# Patient Record
Sex: Female | Born: 1940 | Race: White | Hispanic: No | Marital: Married | Smoking: Never smoker
Health system: Southern US, Community
[De-identification: ages and names within clinical notes are randomized; demographics above are authoritative.]

## PROBLEM LIST (undated history)

## (undated) DIAGNOSIS — E669 Obesity, unspecified: Secondary | ICD-10-CM

## (undated) DIAGNOSIS — E538 Deficiency of other specified B group vitamins: Secondary | ICD-10-CM

## (undated) DIAGNOSIS — R350 Frequency of micturition: Secondary | ICD-10-CM

## (undated) DIAGNOSIS — E785 Hyperlipidemia, unspecified: Secondary | ICD-10-CM

## (undated) DIAGNOSIS — M1990 Unspecified osteoarthritis, unspecified site: Secondary | ICD-10-CM

## (undated) DIAGNOSIS — N6019 Diffuse cystic mastopathy of unspecified breast: Secondary | ICD-10-CM

## (undated) DIAGNOSIS — R55 Syncope and collapse: Secondary | ICD-10-CM

## (undated) DIAGNOSIS — I10 Essential (primary) hypertension: Secondary | ICD-10-CM

## (undated) DIAGNOSIS — D688 Other specified coagulation defects: Secondary | ICD-10-CM

## (undated) DIAGNOSIS — K219 Gastro-esophageal reflux disease without esophagitis: Secondary | ICD-10-CM

## (undated) DIAGNOSIS — R413 Other amnesia: Secondary | ICD-10-CM

## (undated) DIAGNOSIS — G629 Polyneuropathy, unspecified: Secondary | ICD-10-CM

## (undated) DIAGNOSIS — F329 Major depressive disorder, single episode, unspecified: Secondary | ICD-10-CM

## (undated) DIAGNOSIS — F3289 Other specified depressive episodes: Secondary | ICD-10-CM

## (undated) HISTORY — DX: Other specified coagulation defects: D68.8

## (undated) HISTORY — DX: Deficiency of other specified B group vitamins: E53.8

## (undated) HISTORY — PX: TOTAL ABDOMINAL HYSTERECTOMY W/ BILATERAL SALPINGOOPHORECTOMY: SHX83

## (undated) HISTORY — DX: Other amnesia: R41.3

## (undated) HISTORY — PX: EXTERNAL EAR SURGERY: SHX627

## (undated) HISTORY — DX: Polyneuropathy, unspecified: G62.9

## (undated) HISTORY — PX: CHOLECYSTECTOMY: SHX55

## (undated) HISTORY — DX: Major depressive disorder, single episode, unspecified: F32.9

## (undated) HISTORY — DX: Hyperlipidemia, unspecified: E78.5

## (undated) HISTORY — DX: Frequency of micturition: R35.0

## (undated) HISTORY — DX: Unspecified osteoarthritis, unspecified site: M19.90

## (undated) HISTORY — PX: RIB FRACTURE SURGERY: SHX2358

## (undated) HISTORY — PX: CARPAL TUNNEL RELEASE: SHX101

## (undated) HISTORY — DX: Obesity, unspecified: E66.9

## (undated) HISTORY — DX: Essential (primary) hypertension: I10

## (undated) HISTORY — DX: Syncope and collapse: R55

## (undated) HISTORY — DX: Gastro-esophageal reflux disease without esophagitis: K21.9

## (undated) HISTORY — DX: Other specified depressive episodes: F32.89

## (undated) HISTORY — PX: OTHER SURGICAL HISTORY: SHX169

## (undated) HISTORY — DX: Diffuse cystic mastopathy of unspecified breast: N60.19

---

## 2000-01-19 ENCOUNTER — Other Ambulatory Visit: Admission: RE | Admit: 2000-01-19 | Discharge: 2000-01-19 | Payer: Self-pay | Admitting: Internal Medicine

## 2000-09-23 ENCOUNTER — Encounter: Admission: RE | Admit: 2000-09-23 | Discharge: 2000-09-23 | Payer: Self-pay | Admitting: Internal Medicine

## 2000-09-23 ENCOUNTER — Encounter: Payer: Self-pay | Admitting: Internal Medicine

## 2002-02-25 ENCOUNTER — Ambulatory Visit (HOSPITAL_COMMUNITY): Admission: RE | Admit: 2002-02-25 | Discharge: 2002-02-25 | Payer: Self-pay | Admitting: Gastroenterology

## 2002-02-25 ENCOUNTER — Encounter (INDEPENDENT_AMBULATORY_CARE_PROVIDER_SITE_OTHER): Payer: Self-pay | Admitting: Specialist

## 2003-11-25 ENCOUNTER — Ambulatory Visit (HOSPITAL_BASED_OUTPATIENT_CLINIC_OR_DEPARTMENT_OTHER): Admission: RE | Admit: 2003-11-25 | Discharge: 2003-11-25 | Payer: Self-pay | Admitting: Orthopedic Surgery

## 2003-11-25 ENCOUNTER — Ambulatory Visit (HOSPITAL_COMMUNITY): Admission: RE | Admit: 2003-11-25 | Discharge: 2003-11-25 | Payer: Self-pay | Admitting: Orthopedic Surgery

## 2004-02-22 ENCOUNTER — Ambulatory Visit (HOSPITAL_COMMUNITY): Admission: RE | Admit: 2004-02-22 | Discharge: 2004-02-22 | Payer: Self-pay | Admitting: Orthopedic Surgery

## 2004-02-22 ENCOUNTER — Ambulatory Visit (HOSPITAL_BASED_OUTPATIENT_CLINIC_OR_DEPARTMENT_OTHER): Admission: RE | Admit: 2004-02-22 | Discharge: 2004-02-22 | Payer: Self-pay | Admitting: Orthopedic Surgery

## 2004-08-02 ENCOUNTER — Emergency Department (HOSPITAL_COMMUNITY): Admission: EM | Admit: 2004-08-02 | Discharge: 2004-08-02 | Payer: Self-pay | Admitting: Emergency Medicine

## 2005-02-01 ENCOUNTER — Ambulatory Visit (HOSPITAL_COMMUNITY): Admission: RE | Admit: 2005-02-01 | Discharge: 2005-02-01 | Payer: Self-pay | Admitting: Orthopedic Surgery

## 2005-02-01 ENCOUNTER — Encounter (INDEPENDENT_AMBULATORY_CARE_PROVIDER_SITE_OTHER): Payer: Self-pay | Admitting: *Deleted

## 2005-02-01 ENCOUNTER — Ambulatory Visit (HOSPITAL_BASED_OUTPATIENT_CLINIC_OR_DEPARTMENT_OTHER): Admission: RE | Admit: 2005-02-01 | Discharge: 2005-02-01 | Payer: Self-pay | Admitting: Orthopedic Surgery

## 2005-04-26 ENCOUNTER — Ambulatory Visit (HOSPITAL_COMMUNITY): Admission: RE | Admit: 2005-04-26 | Discharge: 2005-04-26 | Payer: Self-pay | Admitting: Gastroenterology

## 2005-10-06 ENCOUNTER — Emergency Department (HOSPITAL_COMMUNITY): Admission: EM | Admit: 2005-10-06 | Discharge: 2005-10-06 | Payer: Self-pay | Admitting: Emergency Medicine

## 2006-02-20 ENCOUNTER — Encounter: Admission: RE | Admit: 2006-02-20 | Discharge: 2006-02-20 | Payer: Self-pay | Admitting: Internal Medicine

## 2007-03-12 ENCOUNTER — Ambulatory Visit: Payer: Self-pay | Admitting: Internal Medicine

## 2007-04-08 ENCOUNTER — Ambulatory Visit: Payer: Self-pay | Admitting: Internal Medicine

## 2007-04-08 LAB — CONVERTED CEMR LAB
BUN: 17 mg/dL (ref 6–23)
Basophils Absolute: 0.1 10*3/uL (ref 0.0–0.1)
Basophils Relative: 1.2 % — ABNORMAL HIGH (ref 0.0–1.0)
Bilirubin Urine: NEGATIVE
Bilirubin, Direct: 0.1 mg/dL (ref 0.0–0.3)
CO2: 29 meq/L (ref 19–32)
Calcium: 9.7 mg/dL (ref 8.4–10.5)
Cholesterol: 181 mg/dL (ref 0–200)
Eosinophils Absolute: 0.3 10*3/uL (ref 0.0–0.6)
GFR calc Af Amer: 80 mL/min
Glucose, Bld: 100 mg/dL — ABNORMAL HIGH (ref 70–99)
Hemoglobin, Urine: NEGATIVE
LDL Cholesterol: 105 mg/dL — ABNORMAL HIGH (ref 0–99)
Lymphocytes Relative: 33.8 % (ref 12.0–46.0)
Monocytes Absolute: 0.4 10*3/uL (ref 0.2–0.7)
Monocytes Relative: 7.5 % (ref 3.0–11.0)
Neutro Abs: 2.6 10*3/uL (ref 1.4–7.7)
Neutrophils Relative %: 51.9 % (ref 43.0–77.0)
Nitrite: NEGATIVE
Specific Gravity, Urine: 1.025 (ref 1.000–1.03)
TSH: 2.85 microintl units/mL (ref 0.35–5.50)
Total Protein, Urine: NEGATIVE mg/dL
Urobilinogen, UA: 0.2 (ref 0.0–1.0)
pH: 6 (ref 5.0–8.0)

## 2007-04-14 ENCOUNTER — Ambulatory Visit: Payer: Self-pay | Admitting: Internal Medicine

## 2007-04-14 DIAGNOSIS — F3289 Other specified depressive episodes: Secondary | ICD-10-CM | POA: Insufficient documentation

## 2007-04-14 DIAGNOSIS — K219 Gastro-esophageal reflux disease without esophagitis: Secondary | ICD-10-CM | POA: Insufficient documentation

## 2007-04-14 DIAGNOSIS — F329 Major depressive disorder, single episode, unspecified: Secondary | ICD-10-CM

## 2007-04-14 DIAGNOSIS — J309 Allergic rhinitis, unspecified: Secondary | ICD-10-CM | POA: Insufficient documentation

## 2007-04-14 DIAGNOSIS — M1610 Unilateral primary osteoarthritis, unspecified hip: Secondary | ICD-10-CM | POA: Insufficient documentation

## 2007-04-14 DIAGNOSIS — I10 Essential (primary) hypertension: Secondary | ICD-10-CM | POA: Insufficient documentation

## 2007-04-14 DIAGNOSIS — Z860100 Personal history of colon polyps, unspecified: Secondary | ICD-10-CM | POA: Insufficient documentation

## 2007-04-14 DIAGNOSIS — A630 Anogenital (venereal) warts: Secondary | ICD-10-CM | POA: Insufficient documentation

## 2007-04-14 DIAGNOSIS — J45909 Unspecified asthma, uncomplicated: Secondary | ICD-10-CM | POA: Insufficient documentation

## 2007-04-14 DIAGNOSIS — E785 Hyperlipidemia, unspecified: Secondary | ICD-10-CM | POA: Insufficient documentation

## 2007-09-23 ENCOUNTER — Telehealth: Payer: Self-pay | Admitting: Internal Medicine

## 2007-10-14 ENCOUNTER — Ambulatory Visit: Payer: Self-pay | Admitting: Internal Medicine

## 2007-10-30 ENCOUNTER — Emergency Department (HOSPITAL_COMMUNITY): Admission: EM | Admit: 2007-10-30 | Discharge: 2007-10-30 | Payer: Self-pay | Admitting: Emergency Medicine

## 2008-05-06 ENCOUNTER — Ambulatory Visit: Payer: Self-pay | Admitting: Internal Medicine

## 2008-05-06 LAB — CONVERTED CEMR LAB
ALT: 23 units/L (ref 0–35)
Albumin: 3.8 g/dL (ref 3.5–5.2)
Alkaline Phosphatase: 65 units/L (ref 39–117)
BUN: 12 mg/dL (ref 6–23)
Chloride: 106 meq/L (ref 96–112)
Direct LDL: 115.2 mg/dL
Glucose, Bld: 80 mg/dL (ref 70–99)
Potassium: 4.1 meq/L (ref 3.5–5.1)
Sodium: 140 meq/L (ref 135–145)
Total CHOL/HDL Ratio: 4.8
Total Protein: 6.9 g/dL (ref 6.0–8.3)
VLDL: 71 mg/dL — ABNORMAL HIGH (ref 0–40)

## 2008-05-07 ENCOUNTER — Encounter: Payer: Self-pay | Admitting: Internal Medicine

## 2008-05-31 ENCOUNTER — Telehealth: Payer: Self-pay | Admitting: Internal Medicine

## 2008-06-03 ENCOUNTER — Ambulatory Visit: Payer: Self-pay | Admitting: Cardiology

## 2008-06-07 ENCOUNTER — Telehealth: Payer: Self-pay | Admitting: Internal Medicine

## 2008-06-07 DIAGNOSIS — J984 Other disorders of lung: Secondary | ICD-10-CM | POA: Insufficient documentation

## 2008-06-10 ENCOUNTER — Ambulatory Visit (HOSPITAL_COMMUNITY): Admission: RE | Admit: 2008-06-10 | Discharge: 2008-06-10 | Payer: Self-pay | Admitting: Internal Medicine

## 2008-06-11 ENCOUNTER — Telehealth: Payer: Self-pay | Admitting: Internal Medicine

## 2008-06-11 ENCOUNTER — Ambulatory Visit: Payer: Self-pay | Admitting: Internal Medicine

## 2008-06-21 ENCOUNTER — Ambulatory Visit: Payer: Self-pay | Admitting: Internal Medicine

## 2008-06-21 ENCOUNTER — Encounter: Payer: Self-pay | Admitting: Internal Medicine

## 2008-06-24 ENCOUNTER — Encounter: Payer: Self-pay | Admitting: Internal Medicine

## 2008-07-26 ENCOUNTER — Ambulatory Visit: Payer: Self-pay | Admitting: Internal Medicine

## 2008-07-26 DIAGNOSIS — J438 Other emphysema: Secondary | ICD-10-CM | POA: Insufficient documentation

## 2008-10-13 ENCOUNTER — Encounter (INDEPENDENT_AMBULATORY_CARE_PROVIDER_SITE_OTHER): Payer: Self-pay | Admitting: *Deleted

## 2008-11-04 ENCOUNTER — Ambulatory Visit: Payer: Self-pay | Admitting: Internal Medicine

## 2008-11-04 LAB — CONVERTED CEMR LAB
AST: 27 units/L (ref 0–37)
BUN: 18 mg/dL (ref 6–23)
Cholesterol: 194 mg/dL (ref 0–200)
Direct LDL: 104.1 mg/dL
GFR calc Af Amer: 71 mL/min
GFR calc non Af Amer: 58 mL/min
Glucose, Bld: 102 mg/dL — ABNORMAL HIGH (ref 70–99)
Total CHOL/HDL Ratio: 5.1
Triglycerides: 439 mg/dL (ref 0–149)
VLDL: 88 mg/dL — ABNORMAL HIGH (ref 0–40)

## 2009-01-24 ENCOUNTER — Encounter: Payer: Self-pay | Admitting: Internal Medicine

## 2009-01-26 ENCOUNTER — Encounter: Payer: Self-pay | Admitting: Internal Medicine

## 2009-04-01 ENCOUNTER — Telehealth: Payer: Self-pay | Admitting: Internal Medicine

## 2009-04-01 ENCOUNTER — Encounter: Payer: Self-pay | Admitting: Internal Medicine

## 2009-04-04 ENCOUNTER — Ambulatory Visit: Payer: Self-pay | Admitting: Internal Medicine

## 2009-04-04 LAB — CONVERTED CEMR LAB
CO2: 29 meq/L (ref 19–32)
Calcium: 9.2 mg/dL (ref 8.4–10.5)
Creatinine, Ser: 0.9 mg/dL (ref 0.4–1.2)
GFR calc non Af Amer: 65.74 mL/min (ref >60)
Sodium: 140 meq/L (ref 135–145)

## 2009-04-06 ENCOUNTER — Ambulatory Visit: Payer: Self-pay | Admitting: Cardiology

## 2009-05-09 ENCOUNTER — Ambulatory Visit: Payer: Self-pay | Admitting: Internal Medicine

## 2009-05-19 ENCOUNTER — Telehealth: Payer: Self-pay | Admitting: Internal Medicine

## 2009-08-16 ENCOUNTER — Ambulatory Visit: Payer: Self-pay | Admitting: Family Medicine

## 2009-08-16 DIAGNOSIS — L723 Sebaceous cyst: Secondary | ICD-10-CM | POA: Insufficient documentation

## 2009-08-17 ENCOUNTER — Ambulatory Visit: Payer: Self-pay | Admitting: Family Medicine

## 2009-08-17 ENCOUNTER — Encounter: Payer: Self-pay | Admitting: Family Medicine

## 2009-08-20 ENCOUNTER — Encounter: Payer: Self-pay | Admitting: Family Medicine

## 2009-10-31 ENCOUNTER — Ambulatory Visit: Payer: Self-pay | Admitting: Internal Medicine

## 2009-10-31 LAB — CONVERTED CEMR LAB
AST: 20 units/L (ref 0–37)
Albumin: 3.7 g/dL (ref 3.5–5.2)
BUN: 13 mg/dL (ref 6–23)
Basophils Absolute: 0 10*3/uL (ref 0.0–0.1)
Calcium: 9.3 mg/dL (ref 8.4–10.5)
Cholesterol: 178 mg/dL (ref 0–200)
Creatinine, Ser: 1 mg/dL (ref 0.4–1.2)
Eosinophils Relative: 2.1 % (ref 0.0–5.0)
Folate: 13.8 ng/mL
GFR calc non Af Amer: 58.11 mL/min (ref >60)
HDL: 49.6 mg/dL (ref >39.00)
Hemoglobin: 13.5 g/dL (ref 12.0–15.0)
Lymphocytes Relative: 29.7 % (ref 12.0–46.0)
Monocytes Absolute: 0.5 10*3/uL (ref 0.1–1.0)
Neutro Abs: 4.3 10*3/uL (ref 1.4–7.7)
Platelets: 198 10*3/uL (ref 150.0–400.0)
Potassium: 3.5 meq/L (ref 3.5–5.1)
RBC: 4.19 M/uL (ref 3.87–5.11)
Sodium: 142 meq/L (ref 135–145)
Total Bilirubin: 1.1 mg/dL (ref 0.3–1.2)
Total CHOL/HDL Ratio: 4
VLDL: 52.6 mg/dL — ABNORMAL HIGH (ref 0.0–40.0)
Vitamin B-12: 1302 pg/mL — ABNORMAL HIGH (ref 211–911)

## 2010-01-25 ENCOUNTER — Encounter: Payer: Self-pay | Admitting: Internal Medicine

## 2010-02-28 ENCOUNTER — Telehealth: Payer: Self-pay | Admitting: Internal Medicine

## 2010-02-28 ENCOUNTER — Ambulatory Visit: Payer: Self-pay | Admitting: Internal Medicine

## 2010-03-29 ENCOUNTER — Telehealth: Payer: Self-pay | Admitting: Internal Medicine

## 2010-05-02 ENCOUNTER — Ambulatory Visit: Payer: Self-pay | Admitting: Internal Medicine

## 2010-05-02 ENCOUNTER — Encounter: Payer: Self-pay | Admitting: Internal Medicine

## 2010-05-02 DIAGNOSIS — R300 Dysuria: Secondary | ICD-10-CM | POA: Insufficient documentation

## 2010-05-02 LAB — CONVERTED CEMR LAB
ALT: 17 units/L (ref 0–35)
AST: 24 units/L (ref 0–37)
Alkaline Phosphatase: 45 units/L (ref 39–117)
BUN: 13 mg/dL (ref 6–23)
Bilirubin, Direct: 0.2 mg/dL (ref 0.0–0.3)
CO2: 30 meq/L (ref 19–32)
Chloride: 101 meq/L (ref 96–112)
Cholesterol: 168 mg/dL (ref 0–200)
Direct LDL: 99.9 mg/dL
Eosinophils Absolute: 0.2 10*3/uL (ref 0.0–0.7)
Eosinophils Relative: 3.4 % (ref 0.0–5.0)
HCT: 39.4 % (ref 36.0–46.0)
HDL: 38.3 mg/dL — ABNORMAL LOW (ref >39.00)
Lymphocytes Relative: 35 % (ref 12.0–46.0)
Lymphs Abs: 2.3 10*3/uL (ref 0.7–4.0)
Monocytes Absolute: 0.5 10*3/uL (ref 0.1–1.0)
Monocytes Relative: 7 % (ref 3.0–12.0)
Neutro Abs: 3.5 10*3/uL (ref 1.4–7.7)
Neutrophils Relative %: 54.1 % (ref 43.0–77.0)
Potassium: 3.9 meq/L (ref 3.5–5.1)
RBC: 4.1 M/uL (ref 3.87–5.11)
Sodium: 142 meq/L (ref 135–145)
Total Bilirubin: 1.1 mg/dL (ref 0.3–1.2)
Total CHOL/HDL Ratio: 4
Triglycerides: 231 mg/dL — ABNORMAL HIGH (ref 0.0–149.0)
VLDL: 46.2 mg/dL — ABNORMAL HIGH (ref 0.0–40.0)

## 2010-05-08 ENCOUNTER — Telehealth: Payer: Self-pay | Admitting: Internal Medicine

## 2010-05-18 ENCOUNTER — Telehealth: Payer: Self-pay | Admitting: Internal Medicine

## 2010-05-25 ENCOUNTER — Telehealth: Payer: Self-pay | Admitting: Internal Medicine

## 2010-07-07 IMAGING — CR DG CHEST 2V
2 series · 2 of 2 positions shown · non-contrast
Comparison: Chest x-ray 10/06/2005

CLINICAL DATA: Right-sided chest pain for several weeks,
hypertension

CHEST - 2 VIEW

[view not recorded (1 of 2)]
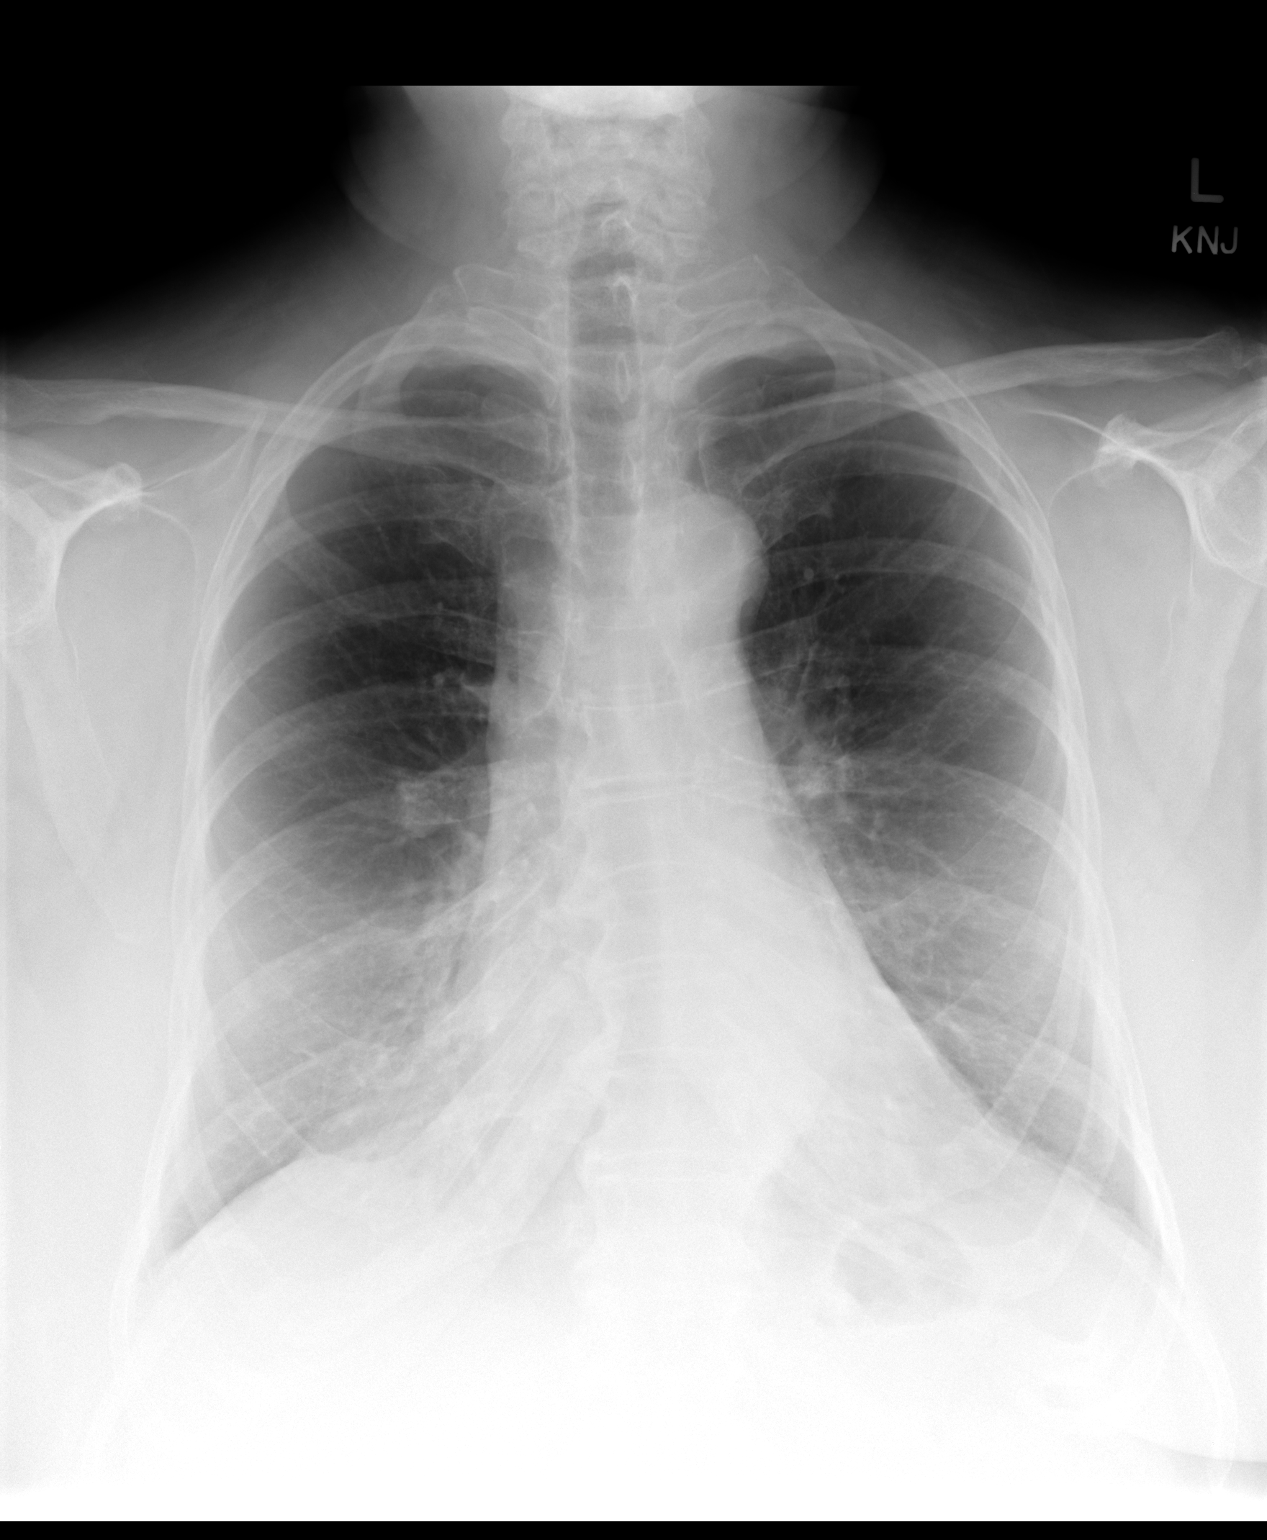

[view not recorded (2 of 2)]
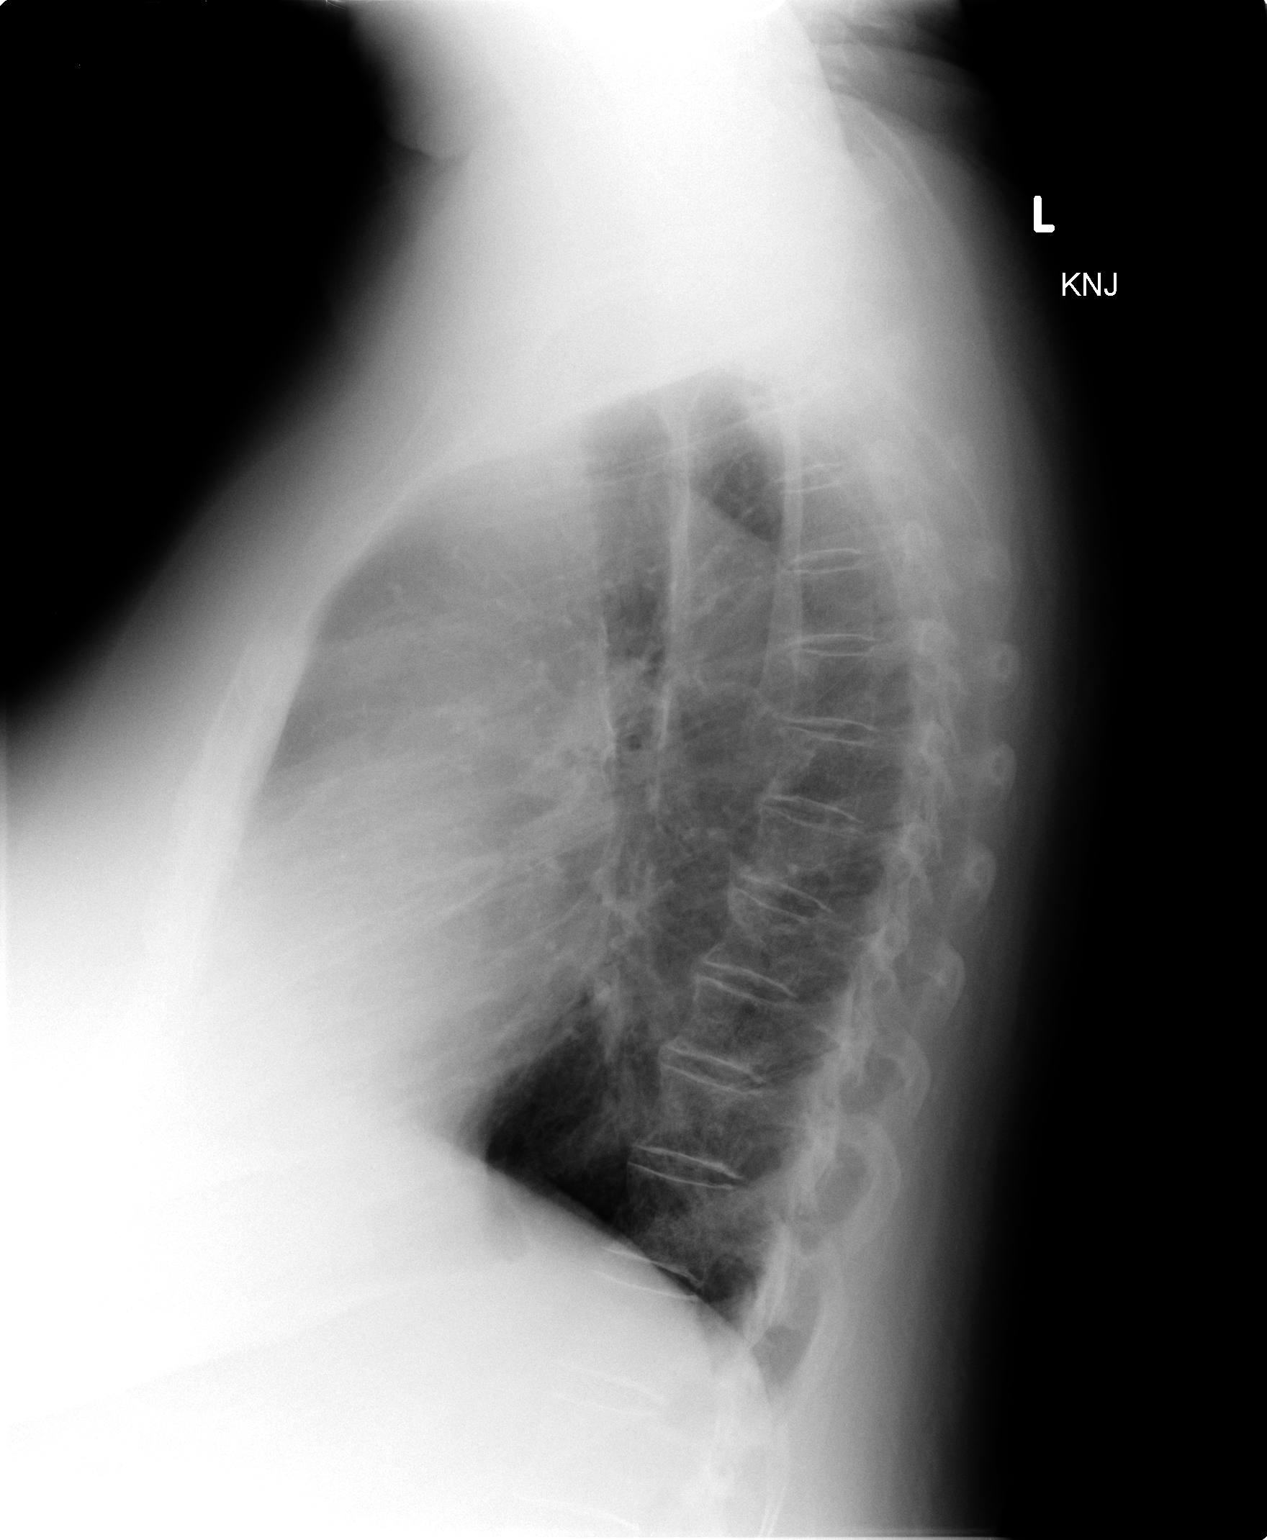

[2 of 2 positions shown; findings below may reference images not displayed]

FINDINGS: No active infiltrate or effusion is seen.  The somewhat
nodular appearance of the left hilum is noted on the frontal view
which is not seen on prior film dating back to [DATE].  Either
follow-up chest x-ray or CT chest is recommended.  Heart is within
normal limits in size.   Mild degenerative change is noted in the
thoracic spine.
IMPRESSION: 1.  No active lung disease.
2.  Slightly nodular left hilum appears to represent a change when
compared to a chest x-ray of 3111.  Consider follow-up chest x-ray
or CT chest to assess further.

## 2010-08-31 ENCOUNTER — Telehealth: Payer: Self-pay | Admitting: Internal Medicine

## 2010-10-10 NOTE — Progress Notes (Signed)
  Phone Note Refill Request Message from:  Fax from Pharmacy on May 08, 2010 2:33 PM  Refills Requested: Medication #1:  NEXIUM 40 MG CPDR 1 by mouth qd  Medication #2:  FLUOXETINE HCL 20 MG  TABS take 1 by mouth once daily  6 MONTH FOLLOW-UP IS DUE Initial call taken by: Ami Bullins CMA,  May 08, 2010 2:33 PM    Prescriptions: FLUOXETINE HCL 20 MG  TABS (FLUOXETINE HCL) take 1 by mouth once daily  6 MONTH FOLLOW-UP IS DUE  #90 x 3   Entered by:   Ami Bullins CMA   Authorized by:   Jacques Navy MD   Signed by:   Bill Salinas CMA on 05/08/2010   Method used:   Faxed to ...       [facility] (mail-order)             , [place]         Ph: [phone]       Fax: [phone]   RxID:   [id number] NEXIUM 40 MG CPDR (ESOMEPRAZOLE MAGNESIUM) 1 by mouth qd  #90 x 3   Entered by:   Ami Bullins CMA   Authorized by:   Jacques Navy MD   Signed by:   Bill Salinas CMA on 05/08/2010   Method used:   Faxed to ...       [facility] (mail-order)             , [place]         Ph: [phone]       Fax: [phone]   RxID:   [id number]

## 2010-10-10 NOTE — Progress Notes (Signed)
Summary: CALL?  Phone Note Call from Patient   Summary of Call: Patient is requesting a call from MD.  Initial call taken by: Lamar Sprinkles, CMA,  May 18, 2010 3:36 PM  Follow-up for Phone Call        Needs refills, she recieved nexium and fluoxetine but not others that were sent in and pharm told pt that others were not recieved. Resent rx's, Pt informed  Follow-up by: Lamar Sprinkles, CMA,  May 18, 2010 5:42 PM    Prescriptions: SULFAMETHOXAZOLE-TRIMETHOPRIM 400-80 MG TABS (SULFAMETHOXAZOLE-TRIMETHOPRIM) 1 by mouth two times a day x 3 days as needed for bladder problems  #60 x 0   Entered by:   Lamar Sprinkles, CMA   Authorized by:   Jacques Navy MD   Signed by:   Lamar Sprinkles, CMA on 05/18/2010   Method used:   Faxed to ...       [facility] (mail-order)             , [place]         Ph: [phone]       Fax: [phone]   RxID:   [id number] AVALIDE 150-12.5 MG TABS (IRBESARTAN-HYDROCHLOROTHIAZIDE) 1 tab by mouth once daily  #90 x 3   Entered by:   Lamar Sprinkles, CMA   Authorized by:   Jacques Navy MD   Signed by:   Lamar Sprinkles, CMA on 05/18/2010   Method used:   Faxed to ...       [facility] (mail-order)             , [place]         Ph: [phone]       Fax: [phone]   RxID:   [id number] MELOXICAM 15 MG TABS (MELOXICAM) 1 by mouth once daily for back pain and other arthritic pain.  #90 x 3   Entered by:   Lamar Sprinkles, CMA   Authorized by:   Jacques Navy MD   Signed by:   Lamar Sprinkles, CMA on 05/18/2010   Method used:   Faxed to ...       [facility] (mail-order)             , [place]         Ph: [phone]       Fax: [phone]   RxID:   [id number] FEXOFENADINE HCL 180 MG  TABS (FEXOFENADINE HCL) once daily  #90 x 3   Entered by:   Lamar Sprinkles, CMA   Authorized by:   Jacques Navy MD   Signed by:   Lamar Sprinkles, CMA on 05/18/2010   Method used:   Faxed to ...       [facility]  (mail-order)             , [place]         Ph: [phone]       Fax: [phone]   RxID:   [id number] CRESTOR 10 MG  TABS (ROSUVASTATIN CALCIUM) q PM  #90 x 3   Entered by:   Lamar Sprinkles, CMA   Authorized by:   Jacques Navy MD   Signed by:   Lamar Sprinkles, CMA on 05/18/2010   Method used:   Faxed to ...       [facility] (mail-order)             , [place]         Ph: [phone]  Fax: [phone]   RxID:   [id number]

## 2010-10-10 NOTE — Assessment & Plan Note (Signed)
Summary: pain under left breast x4 days/sob/talking to sarah/cd   Vital Signs:  Patient profile:   70 year old female Height:      65 inches Weight:      192 pounds BMI:     32.07 O2 Sat:      93 % Temp:     97.0 degrees F oral Pulse rate:   85 / minute BP sitting:   112 / 82  (left arm) Cuff size:   regular  Vitals Entered By: Bill Salinas CMA (February 28, 2010 11:58 AM) CC: pt here  with c/o pain under her left breast , pt states pain is painful to the touch. symptoms present x 1 week/ ab   Primary Care Provider:  Jacques Navy MD  CC:  pt here  with c/o pain under her left breast  and pt states pain is painful to the touch. symptoms present x 1 week/ ab.  History of Present Illness: 7-10 days of acute pain in the left chest: exacerbated by any movement. The pain is limiting her activities. No diaphoresis, no substernal chest chest. No fever, chills, productive cough. She has had some releif with aspercreme, roll on freeze.   Current Medications (verified): 1)  Nexium 40 Mg Cpdr (Esomeprazole Magnesium) .Marland Kitchen.. 1 By Mouth Qd 2)  Crestor 10 Mg  Tabs (Rosuvastatin Calcium) .... Q Pm 3)  Fexofenadine Hcl 180 Mg  Tabs (Fexofenadine Hcl) .... Once Daily 4)  Meloxicam 15 Mg Tabs (Meloxicam) .Marland Kitchen.. 1 By Mouth Once Daily For Back Pain and Other Arthritic Pain. 5)  Fluoxetine Hcl 20 Mg  Tabs (Fluoxetine Hcl) .... Take 1 By Mouth Once Daily  6 Month Follow-Up Is Due 6)  Apap 500 Mg Caps (Acetaminophen) .Marland Kitchen.. 1 0r 2 Every 8 Hrs As Needed 7)  Vitamin B-12 1000 Mcg Tabs (Cyanocobalamin) .Marland Kitchen.. 1 Tab Qd 8)  Avalide 150-12.5 Mg Tabs (Irbesartan-Hydrochlorothiazide) .Marland Kitchen.. 1 Tab By Mouth Once Daily 9)  Vitamin D 1000 Unit Tabs (Cholecalciferol) .Marland Kitchen.. 1 Tab By Mouth Once Daily 10)  Lortab 5 5-500 Mg Tabs (Hydrocodone-Acetaminophen) .... 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  The patient denies anorexia, fever, weight loss, weight gain, syncope, peripheral edema, and abdominal pain.    Physical Exam  General:  overweight white female in acute discomfort Head:  Normocephalic and atraumatic without obvious abnormalities. No apparent alopecia or balding. Neck:  supple.   Chest Wall:  exquistely tender in the intercostal space R6-7, 7-8 left from mid-clavicular line to axilla.  Lungs:  normal respiratory effort and normal breath sounds.   Heart:  normal rate and regular rhythm.   Skin:  no rash    Impression & Recommendations:  Problem # 1:  COSTOCHONDRITIS, LEFT (ICD-733.6) chest wall tenderness c/w costochondritis  Plan - prednisone 30 x 3, 20 x 3 , 10 x 6  aspercreme, etc.   Complete Medication List: 1)  Nexium 40 Mg Cpdr (Esomeprazole magnesium) .Marland Kitchen.. 1 by mouth qd 2)  Crestor 10 Mg Tabs (Rosuvastatin calcium) .... Q pm 3)  Fexofenadine Hcl 180 Mg Tabs (Fexofenadine hcl) .... Once daily 4)  Meloxicam 15 Mg Tabs (Meloxicam) .Marland Kitchen.. 1 by mouth once daily for back pain and other arthritic pain. 5)  Fluoxetine Hcl 20 Mg Tabs (Fluoxetine hcl) .... Take 1 by mouth once daily  6 month follow-up is due 6)  Apap 500 Mg Caps (Acetaminophen) .Marland Kitchen.. 1 0r 2 every 8 hrs as needed 7)  Vitamin B-12 1000 Mcg Tabs (Cyanocobalamin) .Marland Kitchen.. 1 tab  qd 8)  Avalide 150-12.5 Mg Tabs (Irbesartan-hydrochlorothiazide) .Marland Kitchen.. 1 tab by mouth once daily 9)  Vitamin D 1000 Unit Tabs (Cholecalciferol) .Marland Kitchen.. 1 tab by mouth once daily 10)  Lortab 5 5-500 Mg Tabs (Hydrocodone-acetaminophen) .... One or two tabs by mouth hs as needed pain 11)  Prednisone 10 Mg Tabs (Prednisone) .... 3 tabs once daily x 3, 2 tabs once daily x 3, 1 tab once daily x 6  Patient Instructions: 1)  chest wall pain is costochondritis - an inflammation of muscles and nerve between ribs. Plan is to take a prednisone  burst and taper, use aspercreme, etc or sports patch for heat. This is not a serious illness although very painful.  Prescriptions: PREDNISONE 10 MG TABS (PREDNISONE) 3 tabs once daily x 3, 2 tabs once daily x 3, 1 tab once daily x 6  #21 x 0   Entered and Authorized by:   Jacques Navy MD   Signed by:   Jacques Navy MD on 02/28/2010   Method used:   Electronically to        [id number] (retail)       [address]       [place], [place]  [address]       Ph: [phone]       Fax: [phone]   RxID:   [id number]

## 2010-10-10 NOTE — Assessment & Plan Note (Signed)
Summary: 6 mos f/u $50 / cd   Vital Signs:  Patient profile:   70 year old female Height:      65 inches Weight:      193.25 pounds BMI:     32.27 O2 Sat:      95 % on Room air Temp:     97.0 degrees F oral Pulse rate:   88 / minute BP sitting:   122 / 60  (left arm)  Vitals Entered By: Lucious Groves (October 31, 2009 1:12 PM)  O2 Flow:  Room air CC: 6 mo f/u--Pt states that she is "Doing pretty good" with no complaints./kb Is Patient Diabetic? No Pain Assessment Patient in pain? no        Primary Care Provider:  Jacques Navy MD  CC:  6 mo f/u--Pt states that she is "Doing pretty good" with no complaints./kb.  History of Present Illness: Patient was last seen in August '10. IN the interval she was seen at [facility] by Dr.  Cathren Harsh for I&D of a sebaceous cyst that required packing and a course of antibiotics. she was started on Keflex and then change to miinocycline. The wound healed and she is doing fine.   She has had no other problems in the interval and reports that she is doing well and feeling well.   Current Medications (verified): 1)  Nexium 40 Mg Cpdr (Esomeprazole Magnesium) .Marland Kitchen.. 1 By Mouth Qd 2)  Crestor 10 Mg  Tabs (Rosuvastatin Calcium) .... Q Pm 3)  Fexofenadine Hcl 180 Mg  Tabs (Fexofenadine Hcl) .... Once Daily 4)  Meloxicam 15 Mg Tabs (Meloxicam) .Marland Kitchen.. 1 By Mouth Once Daily For Back Pain and Other Arthritic Pain. 5)  Fluoxetine Hcl 20 Mg  Tabs (Fluoxetine Hcl) .... Take 1 By Mouth Once Daily  6 Month Follow-Up Is Due 6)  Apap 500 Mg Caps (Acetaminophen) .Marland Kitchen.. 1 0r 2 Every 8 Hrs As Needed 7)  Vitamin B-12 1000 Mcg Tabs (Cyanocobalamin) .Marland Kitchen.. 1 Tab Qd 8)  Avalide 150-12.5 Mg Tabs (Irbesartan-Hydrochlorothiazide) .Marland Kitchen.. 1 Tab By Mouth Once Daily 9)  Vitamin D 1000 Unit Tabs (Cholecalciferol) .Marland Kitchen.. 1 Tab By Mouth Once Daily 10)  Lortab 5 5-500 Mg Tabs (Hydrocodone-Acetaminophen) .... 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) ARTHRITIS, HIP (ICD-716.95)  Past Surgical History: Last updated: 04/14/2007 bladder suspension x 2 ear surgery Implantation of defibrillator Carpal tunnel release Cataract extraction Cholecystectomy Hysterectomy lumpectomy R - benign lumpectomy L - benign  Family History: Last updated: 05/19/08 father - deceased @78 ; CVA mother - deceased @ 47: multiple myloma, HTN, Lipid P Aunt - breast cancer Neg- colon cancer; DM;  Brother - CAD/MI fatal @ 67  Social History: Last updated: 2008/05/19 HSG married '55 - 22 yrs,widowed; married '77- 30 yrs, divorced; single 1 son -'69; 1 daughter - '67; 1 grandchild work: mfg work in a Systems analyst, 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    Physical Exam  General:  heavyset white female in no distress Head:  normocephalic and atraumatic.   Eyes:  vision grossly intact, pupils equal, pupils round, corneas and lenses clear, and no injection.   Ears:  R ear normal and L ear normal.   Mouth:  full dentures in place. No oral or pharyngeal lesions Neck:  full ROM, no thyromegaly, and no carotid bruits.   Lungs:  Normal respiratory effort, chest  expands symmetrically. Lungs are clear to auscultation, no crackles or wheezes. Heart:  normal rate, regular rhythm, no gallop, and no JVD.   Abdomen:  soft and no distention.   Msk:  no joint tenderness, no joint swelling, no joint warmth, no joint deformities, and no joint instability.   Pulses:  2+ radial Extremities:  No clubbing, cyanosis, edema, or deformity noted with normal full range of motion of all joints.   Neurologic:  alert & oriented X3, cranial nerves II-XII intact, strength normal in all extremities, sensation intact to pinprick, and gait normal.   Skin:  turgor normal, color normal, no rashes, no ecchymoses, and no ulcerations.   Cervical Nodes:  no anterior cervical adenopathy and no posterior cervical adenopathy.   Psych:  Oriented X3, normally interactive, and good eye contact.     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 1 tab by mouth once daily  Orders: TLB-BMP (Basic Metabolic Panel-BMET) (80048-METABOL)  BP today: 122/60 Prior BP: 116/74 (08/17/2009)  BP appears to be stable. will check routine labs.  Problem # 3:  HYPERLIPIDEMIA (ICD-272.4) Due for follow-up labs  Her updated medication list for this problem includes:    Crestor 10 Mg Tabs (Rosuvastatin calcium) ..... Q pm  Orders: TLB-Lipid Panel (80061-LIPID) TLB-Hepatic/Liver Function Pnl (80076-HEPATIC)  good control on present meds. Continue the same   Complete Medication List: 1)  Nexium 40 Mg Cpdr (Esomeprazole magnesium) .Marland Kitchen.. 1 by mouth qd 2)  Crestor 10 Mg Tabs (Rosuvastatin calcium) .... Q pm 3)  Fexofenadine Hcl 180 Mg Tabs (Fexofenadine hcl) .... Once daily 4)  Meloxicam 15 Mg Tabs (Meloxicam) .Marland Kitchen.. 1 by mouth once daily for back pain and other arthritic  pain. 5)  Fluoxetine Hcl 20 Mg Tabs (Fluoxetine hcl) .... Take 1 by mouth once daily  6 month follow-up is due 6)  Apap 500 Mg Caps (Acetaminophen) .Marland Kitchen.. 1 0r 2 every 8 hrs as needed 7)  Vitamin B-12 1000 Mcg Tabs (Cyanocobalamin) .Marland Kitchen.. 1 tab qd 8)  Avalide 150-12.5 Mg Tabs (Irbesartan-hydrochlorothiazide) .Marland Kitchen.. 1 tab by mouth once daily 9)  Vitamin D 1000 Unit Tabs (Cholecalciferol) .Marland Kitchen.. 1 tab by mouth once daily 10)  Lortab 5 5-500 Mg Tabs (Hydrocodone-acetaminophen) .... One or two tabs by mouth hs as needed pain  Other Orders: TLB-CBC Platelet - w/Differential (85025-CBCD) TLB-B12 + Folate Pnl (59563_87564-P32/RJJ)     Preventive Care Screening  Last Flu Shot:    Date:  05/25/2009    Results:  given

## 2010-10-10 NOTE — Progress Notes (Signed)
    Preventive Care Screening  Mammogram:    Date:  01/25/2010    Results:  normal bilateral

## 2010-10-10 NOTE — Progress Notes (Signed)
Summary: NEED F/U OV   ---- Converted from flag ---- ---- 05/25/2010 4:43 PM, Jacques Navy MD wrote: Feb '12. Will need lipid 272.4, hepatic 995.20  ---- 05/18/2010 5:44 PM, Lamar Sprinkles, CMA wrote: When is pt due for next follow up? Does she need labs prior? ------------------------------  Phone Note From Other Clinic   Summary of Call: See notes from MD. Please help get patient scheduled. THANKS Initial call taken by: Lamar Sprinkles, CMA,  May 25, 2010 6:11 PM  Follow-up for Phone Call        PT MADE AN APPT FOR Oct 31 2010.  AWARE TO GO TO THE LAB THE WEEK BEFORE. Follow-up by: Hilarie Fredrickson,  May 29, 2010 9:34 AM

## 2010-10-10 NOTE — Assessment & Plan Note (Signed)
Summary: 6 MO ROV /NWS  #   Vital Signs:  Patient profile:   70 year old female Height:      65 inches Weight:      192 pounds BMI:     32.07 O2 Sat:      94 % on Room air Temp:     97.9 degrees F oral Pulse rate:   73 / minute BP sitting:   112 / 82  (left arm) Cuff size:   regular  Vitals Entered By: Bill Salinas CMA (May 02, 2010 1:09 PM)  O2 Flow:  Room air CC: follow-up visit/ ab   Primary Care Provider:  Jacques Navy MD  CC:  follow-up visit/ ab.  History of Present Illness: Patient presents for general preventive/wellness exam.  She reports that she has in the past been prescribe "sulfa" drug to take as needed for "heaviness in the bladder." She reports that she would usually tkae a single dose to resolve her symptoms. She has trouble since her second bladder suspension surgery. She reports that she had previously tried pyridium with poor results.   She otherwise that she has been feeling well. the pain under the breast for which she was seen has resolved. She does report that she feels fatigued much of the time with no focal complaints. or problems.  She reports that she doesn't sleep well. Her former husband says she snores. She does not have excessive daytime somnolence.   Chart reviewed: she did have I&D sebaceous cyst Left axilla by Dr. Donna Christen at [facility] December '10. This has resolved.       Preventive Screening-Counseling & Management  Alcohol-Tobacco     Alcohol drinks/day: 0     Smoking Status: never  Caffeine-Diet-Exercise     Diet Comments: not on any diet     Does Patient Exercise: yes     Type of exercise: walking     Exercise (avg: min/session): <30     Times/week: 3     MSH Depression Score: not depressed  Hep-HIV-STD-Contraception     Dental Visit-last 6 months no     SBE monthly: no     Sun Exposure-Excessive: no  Safety-Violence-Falls     Seat Belt Use: yes     Firearms in the Home: no firearms in the  home     Smoke Detectors: yes     Fall Risk: no      Drug Use:  never.        Blood Transfusions:  no.    Current Medications (verified): 1)  Nexium 40 Mg Cpdr (Esomeprazole Magnesium) .Marland Kitchen.. 1 By Mouth Qd 2)  Crestor 10 Mg  Tabs (Rosuvastatin Calcium) .... Q Pm 3)  Fexofenadine Hcl 180 Mg  Tabs (Fexofenadine Hcl) .... Once Daily 4)  Meloxicam 15 Mg Tabs (Meloxicam) .Marland Kitchen.. 1 By Mouth Once Daily For Back Pain and Other Arthritic Pain. 5)  Fluoxetine Hcl 20 Mg  Tabs (Fluoxetine Hcl) .... Take 1 By Mouth Once Daily  6 Month Follow-Up Is Due 6)  Apap 500 Mg Caps (Acetaminophen) .Marland Kitchen.. 1 0r 2 Every 8 Hrs As Needed 7)  Vitamin B-12 1000 Mcg Tabs (Cyanocobalamin) .Marland Kitchen.. 1 Tab Qd 8)  Avalide 150-12.5 Mg Tabs (Irbesartan-Hydrochlorothiazide) .Marland Kitchen.. 1 Tab By Mouth Once Daily 9)  Vitamin D 1000 Unit Tabs (Cholecalciferol) .Marland Kitchen.. 1 Tab By Mouth Once Daily 10)  Lortab 5 5-500 Mg Tabs (Hydrocodone-Acetaminophen) .... 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) VENEREAL WART (ICD-078.11) ARTHRITIS, HIP (ICD-716.95)  Past Surgical History: Last updated: 04/14/2007 bladder suspension x 2 ear surgery Implantation of defibrillator Carpal tunnel release Cataract extraction Cholecystectomy Hysterectomy lumpectomy R - benign lumpectomy L - benign  Family History: Last updated: 05/14/2008 father - deceased @78 ; CVA mother - deceased @ 37: multiple myloma, HTN, Lipid P Aunt - breast cancer Neg- colon cancer; DM;  Brother - CAD/MI fatal @ 55  Social History: Last updated: 14-May-2008 HSG married '55 - 22 yrs,widowed; married '77- 30 yrs, divorced; single 1 son -'47; 1 daughter -  '67; 1 grandchild work: mfg work in a Systems analyst, 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  The patient denies fever, weight loss, weight gain, vision loss, chest pain, syncope, prolonged cough, abdominal pain, severe indigestion/heartburn, suspicious skin lesions, difficulty walking, abnormal bleeding, enlarged lymph nodes, angioedema, and breast masses.    Physical Exam  General:  overweight white female in no distress Head:  Normocephalic and atraumatic without obvious abnormalities. No apparent alopecia or balding. Eyes:  vision grossly intact, pupils equal, pupils round, corneas and lenses clear, and no injection.   Ears:  External ear exam shows no significant lesions or deformities.  Otoscopic examination reveals clear canals, tympanic membranes are intact bilaterally without bulging, retraction, inflammation or discharge. Hearing is grossly normal bilaterally. Nose:  no external deformity and no external erythema.   Mouth:  full dentures. No buccal lesions, Posterior pharynx clear Neck:  supple, full ROM, no thyromegaly, and no carotid bruits.   Chest Wall:  No deformities, masses, or tenderness noted. Breasts:  No mass, nodules, thickening, tenderness, bulging, retraction, inflamation, nipple discharge or skin changes noted.   Lungs:  Normal respiratory effort, chest expands symmetrically. Lungs are clear to auscultation, no crackles or wheezes. Heart:  normal rate, regular rhythm, no murmur, no gallop, no JVD, and no HJR.    12 lead EKG - low voltage c/w pulmonary disease otherwise normal Abdomen:  soft, non-tender, normal bowel sounds, no guarding, and no hepatomegaly.   Genitalia:  deferred s/p hysterectomy Msk:  normal  ROM, no joint tenderness, no joint swelling, no redness over joints, and no joint instability.   Pulses:  2+ radial and DP pulses Extremities:  No clubbing, cyanosis, edema, or deformity noted with normal full range of motion of all joints.   Neurologic:  alert & oriented X3, cranial nerves II-XII intact, strength normal in all extremities, gait normal, and DTRs symmetrical and normal.   Skin:  turgor normal, color normal, no suspicious lesions, and no ulcerations.   Cervical Nodes:  no anterior cervical adenopathy and no posterior cervical adenopathy.   Axillary Nodes:  no R axillary adenopathy and no L axillary adenopathy.   Psych:  Oriented X3, memory intact for recent and remote, normally interactive, good eye contact, and not depressed appearing.     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 She is not limited in her  activities. Monitoring spirometry reveals FVC 55% predicted and a lung age of 70. Picture consistent with emphysema.  Plan - no med changes           repeat monitoring in 1 year  Orders: Spirometry w/Graph (94010)  Problem # 2:  HYPERTENSION (ICD-401.9)  Her updated medication list for this problem includes:    Avalide 150-12.5 Mg Tabs (Irbesartan-hydrochlorothiazide) .Marland Kitchen... 1 tab by mouth once daily  Orders: TLB-BMP (Basic Metabolic Panel-BMET) (80048-METABOL) EKG w/ Interpretation (93000)  BP today: 112/82 Prior BP: 112/82 (02/28/2010)  Labs Reviewed: K+: 3.5 (10/31/2009) Creat: : 1.0 (10/31/2009)   Chol: 178 (10/31/2009)   HDL: 49.60 (10/31/2009)   LDL: DEL (11/04/2008)   TG: 263.0 (10/31/2009)  Good control  Problem # 3:  HYPERLIPIDEMIA (ICD-272.4)  Her updated medication list for this problem includes:    Crestor 10 Mg Tabs (Rosuvastatin calcium) ..... Q pm  Orders: TLB-Lipid Panel (80061-LIPID) TLB-Hepatic/Liver Function Pnl (80076-HEPATIC) TLB-TSH (Thyroid Stimulating  Hormone) (84443-TSH)  ADDENDUM- good control of cholesterol with LDL 99.9  Problem # 4:  DYSURIA, CHRONIC (ICD-788.1) Patient reports that she has chronic intermittent symptoms of bladder pressure, "like everything is going to fall out," for which she was previously treated with as needed sulfamethoxazole.  Plan - will allow her to take as needed Septra (generic) for bladder discomfort.   Her updated medication list for this problem includes:    Sulfamethoxazole-trimethoprim 400-80 Mg Tabs (Sulfamethoxazole-trimethoprim) .Marland Kitchen... 1 by mouth two times a day x 3 days as needed for bladder problems  Problem # 5:  Preventive Health Care (ICD-V70.0) Unremarkable interval history. Physical exam is normal except for weight. Lab results are normal. She is current with mammography. Last colonoscopy '06 - normal, due for follow-up 2016. Immunization status: tetnus '08, pneumonia vaccine '06, shingles given today.  Ms. Wik has a history of depression but is doing well with no signs or symptoms of depression at this time. She remains independent in all ADLs. She has no fall or injury risk. She is advised to develop a regular exercise program.  In summary - a very nice woman who is medically stable at this time.   Complete Medication List: 1)  Nexium 40 Mg Cpdr (Esomeprazole magnesium) .Marland Kitchen.. 1 by mouth qd 2)  Crestor 10 Mg Tabs (Rosuvastatin calcium) .... Q pm 3)  Fexofenadine Hcl 180 Mg Tabs (Fexofenadine hcl) .... Once daily 4)  Meloxicam 15 Mg Tabs (Meloxicam) .Marland Kitchen.. 1 by mouth once daily for back pain and other arthritic pain. 5)  Fluoxetine Hcl 20 Mg Tabs (Fluoxetine hcl) .... Take 1 by mouth once daily  6 month follow-up is due 6)  Apap 500 Mg Caps (Acetaminophen) .Marland Kitchen.. 1 0r 2 every 8 hrs as needed 7)  Vitamin B-12 1000 Mcg Tabs (Cyanocobalamin) .Marland Kitchen.. 1 tab qd 8)  Avalide 150-12.5 Mg Tabs (Irbesartan-hydrochlorothiazide) .Marland Kitchen.. 1 tab by mouth once daily 9)  Vitamin D 1000 Unit Tabs (Cholecalciferol)  .Marland Kitchen.. 1 tab by mouth once daily 10)  Lortab 5 5-500 Mg Tabs (Hydrocodone-acetaminophen) .... One or two tabs by mouth hs as needed pain 11)  Sulfamethoxazole-trimethoprim 400-80 Mg Tabs (Sulfamethoxazole-trimethoprim) .Marland Kitchen.. 1 by mouth two times a day x 3 days as needed for bladder problems  Other Orders: TLB-CBC Platelet - w/Differential (85025-CBCD) Zoster (Shingles) Vaccine Live (09811) Admin 1st Vaccine (91478) [facility] -Subsequent Annual Wellness Visit [phone])   Patient: Madeline Melendez Note: All result statuses are Final unless otherwise noted.  Tests: (1) BMP (METABOL)   Sodium  142 mEq/L                   135-145   Potassium                 3.9 mEq/L                   3.5-5.1   Chloride                  101 mEq/L                   96-112   Carbon Dioxide            30 mEq/L                    19-32   Glucose                   86 mg/dL                    91-47   BUN                       13 mg/dL                    8-29   Creatinine                1.0 mg/dL                   5.6-2.1   Calcium                   9.3 mg/dL                   3.0-86.5   GFR                       56.72 mL/min                >60  Tests: (2) Lipid Panel (LIPID)   Cholesterol               168 mg/dL                   7-846     ATP III Classification            Desirable:  < 200 mg/dL                    Borderline High:  200 - 239 mg/dL               High:  > = 240 mg/dL   Triglycerides        [H]  231.0 mg/dL                 9.6-295.2     Normal:  <150 mg/dL     Borderline High:  841 - 199 mg/dL   HDL                  [L]  32.44 mg/dL                 >01.02   VLDL Cholesterol     [H]  46.2 mg/dL                  7.2-53.6  CHO/HDL Ratio:  CHD Risk  4                    Men          Women     1/2 Average Risk     3.4          3.3     Average Risk          5.0          4.4     2X Average Risk          9.6          7.1     3X Average Risk          15.0           11.0                           Tests: (3) Hepatic/Liver Function Panel (HEPATIC)   Total Bilirubin           1.1 mg/dL                   3.8-1.0   Direct Bilirubin          0.2 mg/dL                   1.7-5.1   Alkaline Phosphatase      45 U/L                      39-117   AST                       24 U/L                      0-37   ALT                       17 U/L                      0-35   Total Protein             6.8 g/dL                    0.2-5.8   Albumin                   4.0 g/dL                    5.2-7.7  Tests: (4) CBC Platelet w/Diff (CBCD)   White Cell Count          6.5 K/uL                    4.5-10.5   Red Cell Count            4.10 Mil/uL                 3.87-5.11   Hemoglobin                13.7 g/dL                   82.4-23.5   Hematocrit                39.4 %  36.0-46.0   MCV                       96.0 fl                     78.0-100.0   MCHC                      34.8 g/dL                   16.1-09.6   RDW                       13.2 %                      11.5-14.6   Platelet Count            227.0 K/uL                  150.0-400.0   Neutrophil %              54.1 %                      43.0-77.0   Lymphocyte %              35.0 %                      12.0-46.0   Monocyte %                7.0 %                       3.0-12.0   Eosinophils%              3.4 %                       0.0-5.0   Basophils %               0.5 %                       0.0-3.0   Neutrophill Absolute      3.5 K/uL                    1.4-7.7   Lymphocyte Absolute       2.3 K/uL                    0.7-4.0   Monocyte Absolute         0.5 K/uL                    0.1-1.0  Eosinophils, Absolute                             0.2 K/uL                    0.0-0.7   Basophils Absolute        0.0 K/uL                    0.0-0.1  Tests: (5) TSH (TSH)   FastTSH                   2.13 uIU/mL  0.35-5.50  Tests: (6) Cholesterol LDL - Direct (DIRLDL)  Cholesterol LDL -  Direct                             99.9 mg/dLPrescriptions: AVALIDE 150-12.5 MG TABS (IRBESARTAN-HYDROCHLOROTHIAZIDE) 1 tab by mouth once daily  #90 x 3   Entered and Authorized by:   Jacques Navy MD   Signed by:   Jacques Navy MD on 05/02/2010   Method used:   Faxed to ...       [facility] (mail-order)             , [place]         Ph: [phone]       Fax: [phone]   RxID:   [id number] FLUOXETINE HCL 20 MG  TABS (FLUOXETINE HCL) take 1 by mouth once daily  6 MONTH FOLLOW-UP IS DUE  #90 x 3   Entered and Authorized by:   Jacques Navy MD   Signed by:   Jacques Navy MD on 05/02/2010   Method used:   Faxed to ...       [facility] (mail-order)             , [place]         Ph: [phone]       Fax: [phone]   RxID:   [id number] MELOXICAM 15 MG TABS (MELOXICAM) 1 by mouth once daily for back pain and other arthritic pain.  #90 x 3   Entered and Authorized by:   Jacques Navy MD   Signed by:   Jacques Navy MD on 05/02/2010   Method used:   Faxed to ...       [facility] (mail-order)             , [place]         Ph: [phone]       Fax: [phone]   RxID:   [id number] FEXOFENADINE HCL 180 MG  TABS (FEXOFENADINE HCL) once daily  #90 x 3   Entered and Authorized by:   Jacques Navy MD   Signed by:   Jacques Navy MD on 05/02/2010   Method used:   Faxed to ...       [facility] (mail-order)             , [place]         Ph: [phone]       Fax: [phone]   RxID:   [id number] CRESTOR 10 MG  TABS (ROSUVASTATIN CALCIUM) q PM  #90 x 3   Entered and Authorized by:   Jacques Navy MD   Signed by:   Jacques Navy MD on 05/02/2010   Method used:   Faxed to ...       [facility] (mail-order)             , [place]         Ph: [phone]       Fax: [phone]   RxID:   [id number] NEXIUM 40 MG CPDR (ESOMEPRAZOLE MAGNESIUM) 1 by mouth qd  #90 x 3   Entered and Authorized by:   Jacques Navy MD   Signed by:   Jacques Navy MD on 05/02/2010   Method used:   Faxed to ...       [facility] Environmental education officer)             ,  [place]         Ph: [phone]       Fax: [phone]   RxID:   [id number] SULFAMETHOXAZOLE-TRIMETHOPRIM 400-80 MG TABS (SULFAMETHOXAZOLE-TRIMETHOPRIM) 1 by mouth two times a day x 3 days as needed for bladder problems  #60 x 0   Entered and Authorized by:   Jacques Navy MD   Signed by:   Jacques Navy MD on 05/02/2010   Method used:   Faxed to ...       [facility] (mail-order)             , [place]         Ph: [phone]       Fax: [phone]   RxID:   [id number]    Immunizations Administered:  Zostavax # 1:    Vaccine Type: Zostavax    Site: left arm    Mfr: Merck    Dose: 0.5 ml    Route: Casas    Given by: Ami Bullins CMA    Exp. Date: 04/05/2011    Lot #: [id number]    VIS given: 06/22/05 given May 02, 2010.

## 2010-10-10 NOTE — Progress Notes (Signed)
  Phone Note Call from Patient   Summary of Call: Pt c/o left sided pain under breast. Area is sore to touch and worse with inspriation x 5 days. Sob x 4 days. Also c/o neck pain. Pt scheduled for apt today at 11:25. Initial call taken by: Lamar Sprinkles, CMA,  February 28, 2010 8:45 AM

## 2010-10-12 NOTE — Progress Notes (Signed)
Summary: RF - waiting on mail order  Phone Note From Pharmacy   Summary of Call: Needs refills while waitin on mail order.  Initial call taken by: Lamar Sprinkles, CMA,  August 31, 2010 2:41 PM    Prescriptions: AVALIDE 150-12.5 MG TABS (IRBESARTAN-HYDROCHLOROTHIAZIDE) 1 tab by mouth once daily  #14 x 0   Entered by:   Lamar Sprinkles, CMA   Authorized by:   Jacques Navy MD   Signed by:   Lamar Sprinkles, CMA on 08/31/2010   Method used:   Telephoned to ...       [facility]  [place] [phone]* (retail)       [address] [place], [place]  [address]       Ph: [phone] or [phone]       Fax: [phone]   RxID:   [id number] FLUOXETINE HCL 20 MG  TABS (FLUOXETINE HCL) take 1 by mouth once daily  6 MONTH FOLLOW-UP IS DUE  #14 x 0   Entered by:   Lamar Sprinkles, CMA   Authorized by:   Jacques Navy MD   Signed by:   Lamar Sprinkles, CMA on 08/31/2010   Method used:   Telephoned to ...       [facility]  [place] [phone]* (retail)       [address] [place], [place]  [address]       Ph: [phone] or [phone]       Fax: [phone]   RxID:   [id number] CRESTOR 10 MG  TABS (ROSUVASTATIN CALCIUM) q PM  #14 x 0   Entered by:   Lamar Sprinkles, CMA   Authorized by:   Jacques Navy MD   Signed by:   Lamar Sprinkles, CMA on 08/31/2010   Method used:   Telephoned to ...       [facility]  [place] [phone]* (retail)       [address] [place], [place]  [address]       Ph: [phone] or [phone]       Fax: [phone]   RxID:   [id number] NEXIUM 40 MG CPDR (ESOMEPRAZOLE MAGNESIUM) 1 by mouth qd  #14 x 0   Entered by:   Lamar Sprinkles, CMA   Authorized by:   Jacques Navy MD   Signed by:   Lamar Sprinkles, CMA on 08/31/2010   Method used:   Telephoned to ...       [facility]  [place] [phone]* (retail)       [address] [place], [place]  [address]       Ph: [phone] or [phone]       Fax: [phone]   RxID:   [id number]

## 2010-10-13 ENCOUNTER — Telehealth: Payer: Self-pay | Admitting: Internal Medicine

## 2010-10-18 NOTE — Progress Notes (Signed)
  Phone Note Refill Request Message from:  Fax from Pharmacy on October 13, 2010 11:07 AM  Refills Requested: Medication #1:  CRESTOR 10 MG  TABS q PM Pt is reqesting to be changed from Crestor to Simvastatin because it is a cheaper alternative, please advise. Pt uses [facility]  Initial call taken by: Ami Bullins CMA,  October 13, 2010 11:08 AM  Follow-up for Phone Call        ok to change to simvastatin 20 mg. Repeat lpid panel in 3 weeks 272.4 Follow-up by: Jacques Navy MD,  October 13, 2010 12:53 PM  Additional Follow-up for Phone Call Additional follow up Details #1::        Patient notified per MD. She states that she will call back to set up labs.Alvy Beal Archie CMA  October 13, 2010 3:20 PM     New/Updated Medications: SIMVASTATIN 20 MG TABS (SIMVASTATIN) Take 1 tablet by mouth once a day Prescriptions: SIMVASTATIN 20 MG TABS (SIMVASTATIN) Take 1 tablet by mouth once a day  #90 x 3   Entered by:   Rock Nephew CMA   Authorized by:   Jacques Navy MD   Signed by:   Rock Nephew CMA on 10/13/2010   Method used:   Faxed to ...       [facility] (mail-order)             , [place]         Ph: [phone]       Fax: [phone]   RxID:   [id number]

## 2010-10-24 ENCOUNTER — Other Ambulatory Visit: Payer: Self-pay

## 2011-01-23 NOTE — Assessment & Plan Note (Signed)
[facility]                           PRIMARY CARE OFFICE NOTE   FAYETTE, GASNER                       MRN:          [id number]  DATE:04/14/2007                            DOB:          [birth year]    Madeline Melendez is a delightful 70 year old woman who was last seen in the  office March 12, 2007, please see that complete dictation.  She returns  today for followup evaluation and exam.   The patient's initial note was reviewed with her in detail with no  additions or corrections.   EXAMINATION:  Temperature was 97, blood pressure 120/78, pulse 92,  weight 191.  GENERAL APPEARANCE:  Is a heavyset woman, looks her stated age, no acute  distress.  HEENT:  Normocephalic/atraumatic, EACs and TMs were unremarkable,  oropharynx with native dentition in good repair, no buccal or palatal  lesions were noted, posterior pharynx was clear, conjunctivae and  sclerae was clear, PERRLA/EOMI, funduscopic exam was unremarkable.  NECK:  Supple without thyromegaly.  No axonopathy was noted in the  cervical, supraclavicular regions.  CHEST:  No CVA tenderness.  LUNGS:  Clear to auscultation and percussion.  CARDIOVASCULAR:  Two plus radial pulse with JVD or carotid bruits.  She  had a quiet precordium with a regular rate and rhythm without murmurs,  rubs or gallops.  BREAST:  Breasts were pendulous, skin was normal, nipples without  discharge.  No fixed mass, lesion or abnormality was otherwise noted.  ABDOMEN:  Soft, no guarding or rebound, no organosplenomegaly was  appreciated.  PELVIC:  Deferred secondary to patient having had hysterectomy for  menorrhagia.  EXTREMITIES:  Without clubbing, cyanosis, edema or deformity.  NEUROLOGIC:  Exam was nonfocal.   CHART REVIEW:  Of note, the patient has had colonoscopy performed April 05, 2005.  Last mammogram dates from Jan 08, 2006 was a normal study.   LABORATORY:  Hemoglobin was 12.8 grams, white count was 5200 with  a  normal differential.  Chemistries were unremarkable.  Serum glucose was  100.  Kidney function normal with a creatinine of 0.9, liver functions  were normal.  Cholesterol 181, triglycerides 200, HDL was 36.1, LDL 105.  Thyroid function normal with a TSH of 2.85, urinalysis was negative.   ASSESSMENT/PLAN:  1. Hyperlipidemia, adequately controlled on her present dose of      Crestor, she will continue the same.  2. Hypertension, patient's blood pressure is very well-controlled on      her present medical regimen which is now Avalide 150/12.5 which she      will continue.  3. Gastrointestinal, patient with significant dyspepsia, controlled at      this time by Nexium 40 mg daily.  4. Health maintenance, patient is current with colorectal cancer      screening.  She is current in regards to mammography.   In summary this is a very pleasant woman who is medically stable at this  time, I would like her to return to see me in 6 months for routine  followup, sooner on an as needed basis.  Rosalyn Gess Norins, MD  Electronically Signed    MEN/MedQ  DD: 04/15/2007  DT: 04/15/2007  Job #: [id number]
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
## 2011-01-23 NOTE — Assessment & Plan Note (Signed)
[facility]                                 ON-CALL NOTE   Madeline Melendez, Madeline Melendez                         MRN:          [id number]  DATE:05/22/2008                            DOB:          03/25/1941    Call from [phone] on May 22, 2008 at 8:36 a.m. stating that Dr.  Debby Bud told her she had inflammation and told her to take  Tylenol for  her rib pain.  She is asking for antibiotics because she is in [place].  She wants something to take care of the problem not just to  take care of the pain.  I explained to her that she would need to talk  to Dr. Debby Bud about that on Monday.  When she asked why Dr. Debby Bud is  not in the office today, I explained that it was Saturday, and I was on-  call for the practice.  The patient had forgotten today was Saturday and  will call the office on Monday to talk to Dr. Debby Bud.     Lelon Perla, DO  Electronically Signed    Shawnie Dapper  DD: 05/22/2008  DT: 05/22/2008  Job #: [id number]   cc:   Rosalyn Gess. Norins, MD

## 2011-01-26 NOTE — Op Note (Signed)
NAME:  Madeline Melendez, Madeline Melendez                ACCOUNT NO.:  [id number]   MEDICAL RECORD NO.:  [id number]          PATIENT TYPE:  AMB   LOCATION:  DSC                          FACILITY:  [facility]   PHYSICIAN:  Katy Fitch. Sypher, M.D. DATE OF BIRTH:  1941-01-06   DATE OF PROCEDURE:  02/01/2005  DATE OF DISCHARGE:                                 OPERATIVE REPORT   PREOPERATIVE DIAGNOSES:  1.  Dupuytren's contracture affecting pretendinous fibers of left ring and      small finger.  2.  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  3.  Stenosing tenosynovitis left ring finger A1 pulley.   POSTOPERATIVE DIAGNOSES:  1.  Dupuytren's contracture affecting pretendinous fibers of left ring and      small finger.  2.  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  3.  Stenosing tenosynovitis left ring finger A1 pulley.   OPERATION:  1.  Resection of palmar fascia pretendinous fibers to long and ring finger,      left palm.  2.  Release of A1 pulley, left thumb.  3.  Release of A1 pulley, left ring finger.   OPERATING SURGEON:  Katy Fitch. Sypher, M.D.   ASSISTANT:  Annye Rusk, PA-C.   ANESTHESIA:  General by LMA, supervising anesthesiologist is Dr. Gelene Mink.   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   She requested release of the A1 pulleys of the thumb and ring finger and  requested that while under anesthesia, we also address the palmar  fibromatosis.   Preoperatively she was carefully advised that the palmar fibromatosis was  going to be a predicament for the remainder of her life. She will likely  have recurrence and other rays and perhaps the distal long and ring fingers,  once this was removed.   Our goal was to facilitate her grasp pattern by relieving the painful  nodules and to prevent early  contracture of the MP joints.   We intend to release the A1 pulleys of the thumb and ring finger to prevent  stenosing tenosynovitis symptoms.   After informed consent, she is brought to the operating room at this time.   PROCEDURE:  [name] is brought to the operating room and placed in  supine position on the operating table. Following induction of general  anesthesia by LMA, the left arm was prepped with Betadine soap and solution  and sterilely draped.   A pneumatic tourniquet was applied proximal brachium.   On examination of left arm with an Esmarch bandage, the arterial tourniquet  was inflated to 220 mmHg.   Procedure commenced with incision directly over the A1 pulley of the thumb.  Subcutaneous tissues were carefully divided taking care to identify the  radial sensory branch. This was identified, split with scalpel and scissors  and thereafter free range of motion of the IP joint recovered. This wound  was repaired with intradermal 3-0 Prolene. Attention was directed to  the pre-  tendinous fibers in the distal palmar crease region of the left palm.  The  transverse incision was fashioned approximately 3 cm in length, exposing the  pretendinous fibers to the long and ring fingers. These were  circumferentially dissected with fine scissors and scalpel separating the  palmar fascia from the deep surface of the dermis and taking care to resect  the septa between the metacarpals.   All of the nodular palmar fibromatosis was resected. This relieved the  tendency for an MP flexion contracture of the long and ring fingers. This  also allowed exposure of the ring finger A1 pulley which was split along its  radial border with scissors releasing the stenosing tenosynovitis of the  ring finger.   Thereafter free range of motion of the ring finger was noted.   This wound was then repaired with intradermal 3-0 Prolene. A compressive  dressing applied with Xeroflo, sterile gauze  and a compressive Ace wrap.   There were no apparent complications.      RVS/MEDQ  D:  02/01/2005  T:  02/01/2005  Job:  [id number]

## 2011-01-26 NOTE — Op Note (Signed)
NAME:  Madeline Melendez, Madeline Melendez                          ACCOUNT NO.:  [id number]   MEDICAL RECORD NO.:  [id number]                   PATIENT TYPE:  AMB   LOCATION:  DSC                                  FACILITY:  [facility]   PHYSICIAN:  Katy Fitch. Naaman Plummer., M.D.          DATE OF BIRTH:  13-Feb-1941   DATE OF PROCEDURE:  02/22/2004  DATE OF DISCHARGE:                                 OPERATIVE REPORT   PREOPERATIVE DIAGNOSIS:  Entrapped neuropathy median nerve, left carpal  tunnel.   POSTOPERATIVE DIAGNOSIS:  Entrapped neuropathy median nerve, left carpal  tunnel.   PROCEDURE:  Release of left transverse carpal ligament.   SURGEON:  Katy Fitch. Sypher, M.D.   ASSISTANT:  Jonni Sanger, P.A.   ANESTHESIA:  General by LMA, supervising anesthesiologist is Kaylyn Layer. Michelle Piper,  M.D.   INDICATIONS FOR PROCEDURE:  The patient is a 69 year old woman referred for  evaluation and management of painful and numb hands.  Clinical examination  revealed signs of bilateral carpal tunnel syndrome.  She is status post  release of her right transverse carpal ligament with a very satisfactory  result.  She now returns for release of her left transverse carpal ligament.   Preoperative electrodiagnostic studies documented significant carpal tunnel  syndrome bilaterally.   DESCRIPTION OF PROCEDURE:  The patient was brought to the operating room and  placed in the supine position on the operating table.   Following induction of general anesthesia by LMA, the left arm was prepped  with Betadine soap and solution and sterilely draped.   A pneumatic tourniquet was applied to the proximal brachium.  Following  exsanguination of the limb with an Esmarch bandage, the arterial tourniquet  on the proximal brachium was inflated to 220 mmHg.  The procedure commenced  with a short incision in the line of the ring finger in the palm.  Subcutaneous tissues were carefully divided revealing the palmar fascia.  This was split  longitudinally to the common sensory branch of the median  nerve.   These were followed back to the transverse carpal ligament which was  carefully isolated from the median nerve.   The ligament was released along its ulnar border extending into the distal  forearm.  This widely opened the carpal canal.  No masses or other  predicaments were noted.   Bleeding points along the margin of the release ligament were  electrocauterized with bipolar current followed by repair of the skin with  intradermal 3-0 Prolene suture.   A compressive dressing was applied with a volar plaster splint maintaining  the wrist in 5 degrees of dorsiflexion.   For aftercare, the patient was given a prescription for Percocet one or two  tablets p.o. q.4-6h p.r.n. pain 20 tablets without refill.   She will return to our office for follow-up in 7 to 10 days for dressing  change and advancement to a therapy program.  Katy Fitch Naaman Plummer., M.D.    RVS/MEDQ  D:  02/22/2004  T:  02/22/2004  Job:  [id number]

## 2011-01-26 NOTE — Op Note (Signed)
NAME:  [name], [name]                          ACCOUNT NO.:  [id number]   MEDICAL RECORD NO.:  [id number]                   PATIENT TYPE:  AMB   LOCATION:  DSC                                  FACILITY:  [facility]   PHYSICIAN:  Katy Fitch. Naaman Plummer., M.D.          DATE OF BIRTH:  Feb 01, 1941   DATE OF PROCEDURE:  11/25/2003  DATE OF DISCHARGE:                                 OPERATIVE REPORT   PREOPERATIVE DIAGNOSES:  Chronic entrapment neuropathy of median nerve,  right carpal tunnel.   POSTOPERATIVE DIAGNOSES:  Chronic entrapment neuropathy of median nerve,  right carpal tunnel.   OPERATION PERFORMED:  Release of right transverse carpal ligament.   SURGEON:  Katy Fitch. Sypher, M.D.   ASSISTANT:  Jonni Sanger, P.A.   ANESTHESIA:  General laryngeal mask airway.   SUPERVISING ANESTHESIOLOGIST:  Maren Beach, M.D.   INDICATIONS FOR PROCEDURE:  Madeline Melendez is a 70 year old woman referred for  evaluation and management of a painful and numb right hand.  Clinical  examination suggested median neuropathy at the level of the transverse  carpal ligament.  Electrodiagnostic studies confirmed carpal tunnel  syndrome.  Due to failure to respond to nonoperative measures, she is  brought to the operating room at this time for release of her right  transverse carpal ligament.   DESCRIPTION OF PROCEDURE:  [name] was brought to the operating room  and placed in supine position upon the operating table.  Following induction  of general anesthesia, the right arm was prepped with Betadine soap and  solution and sterilely draped.  Following exsanguination of the limb with an  Esmarch bandage, an arterial tourniquet on the proximal brachium was  inflated to 220 mmHg.  The procedure commenced with a short incision in line  with the ring finger in the palm.  Subcutaneous tissues are carefully  divided revealing the palmar fascia.  This was split longitudinally to  reveal the common  sensory branch of the median nerve.  These were followed  back to the transverse carpal ligament which was carefully isolated from the  median nerve.  The ligament was released on its ulnar border extending to  the distal forearm.  This widely opened the carpal canal.  Bleeding points  along the margin of the released ligament were electrocauterized with  bipolar current followed by repair of the skin with intradermal 3-0 Prolene  suture.  Compressive dressing was applied with a volar plaster splint  maintaining the wrist in five degrees dorsiflexion.   For aftercare [name] was given a prescription for Percocet.  She will  return to our office for follow-up in a week for dressing change and  advancement to an exercise program.  Katy Fitch Naaman Plummer., M.D.    RVS/MEDQ  D:  11/25/2003  T:  11/27/2003  Job:  [id number]

## 2011-01-26 NOTE — Procedures (Signed)
[facility]  Patient:    Madeline, Melendez Visit Number: [id number] MRN: [id number]          Service Type: END Location: ENDO Attending Physician:  Dennison Bulla Ii Dictated by:   Verlin Grills, M.D. Proc. Date: 02/25/02 Admit Date:  02/25/2002   CC:         Pearla Dubonnet, M.D.   Procedure Report  PROCEDURE:  Screening colonoscopy.  REFERRING PHYSICIAN:  Pearla Dubonnet, M.D.  INDICATION FOR PROCEDURE:  Madeline Melendez is a 70 year old female born 11-Dec-1940. Madeline Melendez is referred for her first screening colonoscopy with polypectomy to prevent colon cancer. Her father developed colon cancer in his early 14s.  I discussed with Madeline Melendez the complications associated with colonoscopy and polypectomy including a 15/1000 risk of bleeding and 12/998 risk of colon perforation requiring surgical repair. Madeline Melendez has signed the operative permit.  ENDOSCOPIST:  Verlin Grills, M.D.  PREMEDICATION:  Versed 9 mg, Demerol 50 mg.  ENDOSCOPE:  Olympus pediatric colonoscope.  DESCRIPTION OF PROCEDURE:  After obtaining informed consent, the patient was placed in the left lateral decubitus position. I administered intravenous Demerol and intravenous Versed to achieve conscious sedation for the procedure. The patients blood pressure, oxygen saturation and cardiac rhythm were monitored throughout the procedure and documented in the medical record.  Anal inspection was normal. Digital rectal exam was normal. The Olympus pediatric video colonoscope was introduced into the rectum and easily advanced to the cecum. Colonic preparation for the exam today was excellent.  RECTUM:  Normal.  SIGMOID COLON/DESCENDING COLON:  At approximately 35 cm from the anal verge, in the distal sigmoid colon, two 0.5 mm sessile polyps were removed with the cold biopsy forceps. There are a few diverticula noted in the left colon.  SPLENIC  FLEXURE:  Normal.  TRANSVERSE COLON:  Normal.  HEPATIC FLEXURE:  Normal.  ASCENDING COLON:  There are a few small diverticula in the ascending colon.  CECUM/ILEOCECAL VALVE:  Normal.  ASSESSMENT:  1. A few small diverticula are noted in the ascending colon and left colon.  2. From the distal sigmoid colon at 35 cm from the anal verge, two 0.5 mm     sessile polyps were removed with the cold biopsy forceps.  RECOMMENDATIONS:  Repeat colonoscopy in five years. Dictated by:   Verlin Grills, M.D. Attending Physician:  Dennison Bulla Ii DD:  02/25/02 TD:  02/26/02 Job: [id number]

## 2011-09-25 ENCOUNTER — Other Ambulatory Visit: Payer: Self-pay | Admitting: Internal Medicine

## 2014-05-20 ENCOUNTER — Ambulatory Visit (INDEPENDENT_AMBULATORY_CARE_PROVIDER_SITE_OTHER): Payer: Commercial Managed Care - HMO | Admitting: Cardiology

## 2014-05-20 ENCOUNTER — Encounter: Payer: Self-pay | Admitting: Cardiology

## 2014-05-20 VITALS — BP 104/70 | HR 84 | Ht 62.0 in | Wt 192.0 lb

## 2014-05-20 DIAGNOSIS — I10 Essential (primary) hypertension: Secondary | ICD-10-CM

## 2014-05-20 DIAGNOSIS — E782 Mixed hyperlipidemia: Secondary | ICD-10-CM | POA: Insufficient documentation

## 2014-05-20 DIAGNOSIS — E669 Obesity, unspecified: Secondary | ICD-10-CM

## 2014-05-20 DIAGNOSIS — R0989 Other specified symptoms and signs involving the circulatory and respiratory systems: Secondary | ICD-10-CM

## 2014-05-20 DIAGNOSIS — R079 Chest pain, unspecified: Secondary | ICD-10-CM

## 2014-05-20 DIAGNOSIS — R0609 Other forms of dyspnea: Secondary | ICD-10-CM

## 2014-05-20 DIAGNOSIS — R0600 Dyspnea, unspecified: Secondary | ICD-10-CM | POA: Insufficient documentation

## 2014-05-20 NOTE — Progress Notes (Signed)
[address] [place], [place]  [address] Phone: [phone] Fax:  [phone]  Date:  05/20/2014   ID:  Madeline Melendez, DOB 11/22/1940, MRN [id number]  PCP:  Pearla Dubonnet, MD   History of Present Illness: Madeline Melendez is a 73 y.o. female here for evaluation of chest pain, dyspnea. In review of clinic note from Dr. Kevan Ny from 04/21/14 she is been having exertional chest tightness and dyspnea that has been progressive with other associated symptom complex over the last 3-4 months. In 2012, stress Myoview was normal, low risk. Sometimes pain is in right upper quadrant of abdomen. Sometimes pain is positional and occasionally it is with exertion. She has never smoked. No OSA, PFT's normal per patient however in past medical history it is documented that she had restrictive lung disease by PFTs in 2013 encourage her [facility] Dr. Frann Rider. SOB with minimal activity. Back of neck pain, right arm. Both her daughter and son have fibromyalgia and she is concerned that she may have a case of this.   Wt Readings from Last 3 Encounters:  05/20/14 192 lb (87.091 kg)  05/02/10 192 lb (87.091 kg)  02/28/10 192 lb (87.091 kg)     Past Medical History  Diagnosis Date  . Hyperlipidemia   . Hypertension    factor V Leiden heterozygosity Restrictive lung disease by PFT, 2013 in [place].  History reviewed. No pertinent past surgical history. cholecystectomy, hysterectomy bilateral breast lumpectomy.  Current Outpatient Prescriptions  Medication Sig Dispense Refill  . acetaminophen (TYLENOL) 500 MG tablet Take 500 mg by mouth every 6 (six) hours as needed.      Marland Kitchen aspirin 81 MG tablet Take 81 mg by mouth daily.      . Calcium Carbonate-Vit D-Min (CALCIUM 1200 PO) Take by mouth.      Marland Kitchen FLUoxetine (PROZAC) 40 MG capsule       . losartan-hydrochlorothiazide (HYZAAR) 100-12.5 MG per tablet Take by mouth daily. Takes 1/2 tablet daily      . meloxicam (MOBIC) 15 MG tablet        . NEXIUM 40 MG capsule       . simvastatin (ZOCOR) 40 MG tablet Take 20 mg by mouth daily.      . vitamin B-12 (CYANOCOBALAMIN) 1000 MCG tablet        No current facility-administered medications for this visit.    Allergies:   No Known Allergies  Social History:  The patient  reports that she has never smoked. She does not have any smokeless tobacco history on file.   History reviewed. No pertinent family history. no early family history of coronary artery disease  ROS:  Please see the history of present illness.   Denies any fevers, chills, orthopnea, PND, syncope, bleeding. Positive for GERD. Chest pain. Shortness of breath.   All other systems reviewed and negative.   PHYSICAL EXAM: VS:  BP 104/70  Pulse 84  Ht  (1.575 m)  Wt 192 lb (87.091 kg)  BMI 35.11 kg/m2 Well nourished, well developed, in no acute distress HEENT: normal, [place]/AT, EOMI Neck: no JVD, normal carotid upstroke, no bruit Cardiac:  normal S1, S2; RRR; no murmur Lungs:  clear to auscultation bilaterally, no wheezing, rhonchi or rales Abd: soft, nontender, no hepatomegaly, no bruitsoverweight Ext: no edema, 2+ distal pulses Skin: warm and dry GU: deferred Neuro: no focal abnormalities noted, AAO x 3  EKG:  04/21/14-sinus rhythm, 74 with nonspecific ST-T  wave flattening, otherwise unremarkable.     ASSESSMENT AND PLAN:  1. Dyspnea on exertion-seems to be progressively getting worse. She states that PFTs were normal (although, past medical history shows restrictive pattern in 2013). No signs of obstructive sleep apnea on sleep study. I will check an echocardiogram to evaluate for structure and function of her heart, evaluate for pulmonary hypertension. She does not have any JVD on exam. No edema. 2. Chest pain-has both typical as well as atypical symptoms. Certainly her dyspnea on exertion could be an anginal equivalent and we will check a nuclear stress test. Pharmacologic. 3. Obesity-encourage weight  loss. This is likely contributing as well to her dyspnea. 4. Hyperlipidemia-last lipid panel in April of 2015 LDL was significantly elevated at 189, previously in the 130 range. She had recently cut her medication in half. Continue to monitor. Dr. Kevan Ny. She's had hyper triglyceridemia with 400 range triglycerides in the past. Currently better controlled.  Signed, Donato Schultz, MD [facility]  05/20/2014 12:14 PM

## 2014-05-20 NOTE — Patient Instructions (Signed)
The current medical regimen is effective;  continue present plan and medications.  Your physician has requested that you have an echocardiogram. Echocardiography is a painless test that uses sound waves to create images of your heart. It provides your doctor with information about the size and shape of your heart and how well your heart's chambers and valves are working. This procedure takes approximately one hour. There are no restrictions for this procedure.  Your physician has requested that you have a lexiscan myoview. For further information please visit www.[email or web address]. Please follow instruction sheet, as given.  Follow up as needed. 

## 2014-05-27 ENCOUNTER — Other Ambulatory Visit: Payer: Self-pay | Admitting: Internal Medicine

## 2014-05-27 DIAGNOSIS — R413 Other amnesia: Secondary | ICD-10-CM

## 2014-05-27 DIAGNOSIS — R42 Dizziness and giddiness: Secondary | ICD-10-CM

## 2014-05-28 ENCOUNTER — Ambulatory Visit
Admission: RE | Admit: 2014-05-28 | Discharge: 2014-05-28 | Disposition: A | Discharge status: Home / Self Care | Payer: Commercial Managed Care - HMO | Source: Ambulatory Visit | Attending: Internal Medicine | Admitting: Internal Medicine

## 2014-05-28 DIAGNOSIS — R42 Dizziness and giddiness: Secondary | ICD-10-CM

## 2014-05-28 DIAGNOSIS — R413 Other amnesia: Secondary | ICD-10-CM

## 2014-06-02 ENCOUNTER — Ambulatory Visit (HOSPITAL_BASED_OUTPATIENT_CLINIC_OR_DEPARTMENT_OTHER): Payer: Medicare PPO | Admitting: Radiology

## 2014-06-02 ENCOUNTER — Ambulatory Visit (HOSPITAL_COMMUNITY): Payer: Medicare PPO | Attending: Cardiology | Admitting: Radiology

## 2014-06-02 VITALS — BP 130/84 | HR 65 | Ht 62.0 in | Wt 190.0 lb

## 2014-06-02 DIAGNOSIS — E669 Obesity, unspecified: Secondary | ICD-10-CM | POA: Diagnosis not present

## 2014-06-02 DIAGNOSIS — E785 Hyperlipidemia, unspecified: Secondary | ICD-10-CM | POA: Insufficient documentation

## 2014-06-02 DIAGNOSIS — R0609 Other forms of dyspnea: Secondary | ICD-10-CM

## 2014-06-02 DIAGNOSIS — R079 Chest pain, unspecified: Secondary | ICD-10-CM

## 2014-06-02 DIAGNOSIS — R0600 Dyspnea, unspecified: Secondary | ICD-10-CM

## 2014-06-02 DIAGNOSIS — R0989 Other specified symptoms and signs involving the circulatory and respiratory systems: Secondary | ICD-10-CM

## 2014-06-02 DIAGNOSIS — R0602 Shortness of breath: Secondary | ICD-10-CM | POA: Diagnosis present

## 2014-06-02 DIAGNOSIS — I10 Essential (primary) hypertension: Secondary | ICD-10-CM | POA: Diagnosis not present

## 2014-06-02 MED ORDER — REGADENOSON 0.4 MG/5ML IV SOLN
0.4000 mg | Freq: Once | INTRAVENOUS | Status: AC
  Administered 2014-06-02: 0.4 mg via INTRAVENOUS

## 2014-06-02 MED ORDER — TECHNETIUM TC 99M SESTAMIBI GENERIC - CARDIOLITE
30.0000 | Freq: Once | INTRAVENOUS | Status: AC | PRN
  Administered 2014-06-02: 30 via INTRAVENOUS

## 2014-06-02 NOTE — Progress Notes (Signed)
Echocardiogram performed.  

## 2014-06-02 NOTE — Progress Notes (Signed)
[facility] SITE 3 NUCLEAR MED [address] [place], [place] [address] [phone]    Cardiology Nuclear Med Study  Madeline Melendez is a 72 y.o. female     MRN : [id number]     DOB: 1940-10-04  Procedure Date: 06/02/2014  Nuclear Med Background Indication for Stress Test:  Evaluation for Ischemia History:  MPI 2012 (normal) EF 65%, Asthma Cardiac Risk Factors: Hypertension  Symptoms:  Chest Pain (last date of chest discomfort was three weeks ago), Dizziness, DOE and Palpitations   Nuclear Pre-Procedure Caffeine/Decaff Intake:  None> 12 hrs NPO After: 6:00pm   Lungs:  clear O2 Sat: 95% on room air. IV 0.9% NS with Angio Cath:  22g  IV Site: R Antecubital x 1, tolerated well IV Started by:  Irean Hong, RN  Chest Size (in):  44 Cup Size: DD  Height:  (1.575 m)  Weight:  190 lb (86.183 kg)  BMI:  Body mass index is 34.74 kg/(m^2). Tech Comments:  N/A    Nuclear Med Study 1 or 2 day study: 2 day  Stress Test Type:  Treadmill/Lexiscan  Reading MD: N/A  Order Authorizing Provider:  Donato Schultz, MD  Resting Radionuclide: Technetium 72m Sestamibi  Resting Radionuclide Dose: 33.0 mCi on 06/03/2014  Stress Radionuclide:  Technetium 19m Sestamibi  Stress Radionuclide Dose: 33.0 mCi on 06/02/2014          Stress Protocol Rest HR: 65 Stress HR: 126  Rest BP: 130/84 Stress BP: 135/79  Exercise Time (min): n/a METS: n/a           Dose of Adenosine (mg):  n/a Dose of Lexiscan: 0.4 mg  Dose of Atropine (mg): n/a Dose of Dobutamine: n/a mcg/kg/min (at max HR)  Stress Test Technologist: Nelson Chimes, BS-ES  Nuclear Technologist:  Harlow Asa, CNMT     Rest Procedure:  Myocardial perfusion imaging was performed at rest 45 minutes following the intravenous administration of Technetium 11m Sestamibi. Rest ECG: NSR - Normal EKG with PVC's and nonspecific T wave abnormality  Stress Procedure:  The patient received IV Lexiscan 0.4 mg over 15-seconds with concurrent  low level exercise and then Technetium 31m Sestamibi was injected at 30-seconds while the patient continued walking one more minute.  Quantitative spect images were obtained after a 45-minute delay. During the infusion of Lexiscan the patient complained of SOB, lump in throat, woozy and a headache.  These symptoms began to resolve in recovery.  Stress ECG: NSR with flattening of ST segments in the inferolateral leads  QPS Raw Data Images:  There is interference from nuclear activity from structures below the diaphragm. This does not affect the ability to read the study. Stress Images:  Normal homogeneous uptake in all areas of the myocardium. Rest Images:  Normal homogeneous uptake in all areas of the myocardium. Subtraction (SDS):  No evidence of ischemia. Transient Ischemic Dilatation (Normal <1.22):  0.85 Lung/Heart Ratio (Normal <0.45):  0.27  Quantitative Gated Spect Images QGS EDV:  49 ml QGS ESV:  11 ml  Impression Exercise Capacity:  Lexiscan with low level exercise. BP Response:  Normal blood pressure response. Clinical Symptoms:  There is dyspnea. ECG Impression:  No significant ST segment change suggestive of ischemia. Comparison with Prior Nuclear Study: No images to compare  Overall Impression:  Normal stress nuclear study.  LV Ejection Fraction: 78%.  LV Wall Motion:  NL LV Function; NL Wall Motion   Signed: Armanda Magic, MD [facility]

## 2014-06-03 ENCOUNTER — Ambulatory Visit (HOSPITAL_COMMUNITY): Payer: Medicare PPO | Attending: Internal Medicine

## 2014-06-03 DIAGNOSIS — R0989 Other specified symptoms and signs involving the circulatory and respiratory systems: Secondary | ICD-10-CM

## 2014-06-03 MED ORDER — TECHNETIUM TC 99M SESTAMIBI GENERIC - CARDIOLITE
30.0000 | Freq: Once | INTRAVENOUS | Status: AC | PRN
  Administered 2014-06-03: 30 via INTRAVENOUS

## 2019-09-22 ENCOUNTER — Emergency Department (INDEPENDENT_AMBULATORY_CARE_PROVIDER_SITE_OTHER): Payer: Medicare HMO

## 2019-09-22 ENCOUNTER — Encounter: Payer: Self-pay | Admitting: Emergency Medicine

## 2019-09-22 ENCOUNTER — Emergency Department (INDEPENDENT_AMBULATORY_CARE_PROVIDER_SITE_OTHER)
Admission: EM | Admit: 2019-09-22 | Discharge: 2019-09-22 | Disposition: A | Discharge status: Home / Self Care | Payer: Medicare HMO | Source: Home / Self Care | Attending: Family Medicine | Admitting: Family Medicine

## 2019-09-22 ENCOUNTER — Other Ambulatory Visit: Payer: Self-pay

## 2019-09-22 DIAGNOSIS — I700 Atherosclerosis of aorta: Secondary | ICD-10-CM | POA: Diagnosis not present

## 2019-09-22 DIAGNOSIS — R059 Cough, unspecified: Secondary | ICD-10-CM

## 2019-09-22 DIAGNOSIS — J9811 Atelectasis: Secondary | ICD-10-CM | POA: Diagnosis not present

## 2019-09-22 DIAGNOSIS — R05 Cough: Secondary | ICD-10-CM

## 2019-09-22 DIAGNOSIS — S0990XA Unspecified injury of head, initial encounter: Secondary | ICD-10-CM

## 2019-09-22 DIAGNOSIS — J069 Acute upper respiratory infection, unspecified: Secondary | ICD-10-CM

## 2019-09-22 DIAGNOSIS — R410 Disorientation, unspecified: Secondary | ICD-10-CM

## 2019-09-22 LAB — POCT URINALYSIS DIP (MANUAL ENTRY)
Glucose, UA: NEGATIVE mg/dL
Leukocytes, UA: NEGATIVE
Nitrite, UA: NEGATIVE
Protein Ur, POC: >=300 mg/dL — AB
Spec Grav, UA: 1.025 (ref 1.010–1.025)
Urobilinogen, UA: 1 E.U./dL
pH, UA: 6 (ref 5.0–8.0)

## 2019-09-22 NOTE — ED Provider Notes (Signed)
[facility]    CSN: [id number] Arrival date & time: 09/22/19  1248      History   Chief Complaint Chief Complaint  Patient presents with  . Fall  . Cough  . Altered Mental Status    HPI Madeline Melendez is a 79 y.o. female.   Patient presents with her son who explains her history (she has dementia).  She fell in her kitchen two days ago, striking her right occipital area.  He does not know if she lost conscious, but she has been more confused than usual and complains of painful lump on her posterior scalp. She developed a cough during the past two days with low grade fever and chills.  She has a past history of pneumonia.  The history is provided by the patient and a relative.    Past Medical History:  Diagnosis Date  . B12 deficiency   . Depressive disorder, not elsewhere classified   . DJD (degenerative joint disease)    back  . Esophageal reflux   . 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 Fibrocystic breast disease (FCBD)   . Hyperlipidemia   . Hypertension   . Memory loss   . Neuropathy   . Obesity, unspecified   . Syncope and collapse   . Urinary frequency     Patient Active Problem List   Diagnosis Date Noted  . Dyspnea 05/20/2014  . Obesity, unspecified 05/20/2014  . Essential hypertension, benign 05/20/2014  . Mixed hyperlipidemia 05/20/2014  . DYSURIA, CHRONIC 05/02/2010  . SEBACEOUS CYST, INFECTED 08/16/2009  . EMPHYSEMA, MILD 07/26/2008  . PULMONARY NODULE, RIGHT UPPER LOBE 06/07/2008  . VENEREAL WART 04/14/2007  . HYPERLIPIDEMIA 04/14/2007  . DEPRESSION 04/14/2007  . HYPERTENSION 04/14/2007  . ALLERGIC RHINITIS 04/14/2007  . ASTHMA 04/14/2007  . GERD 04/14/2007  . ARTHRITIS, HIP 04/14/2007  . COLONIC POLYPS, HX OF 04/14/2007    Past Surgical History:  Procedure Laterality Date  . bladder tack    . breast lumpectomy Bilateral   . CARPAL TUNNEL RELEASE Right   . CHOLECYSTECTOMY    . EXTERNAL EAR SURGERY Right   . RIB  FRACTURE SURGERY    . TOTAL ABDOMINAL HYSTERECTOMY W/ BILATERAL SALPINGOOPHORECTOMY      OB History   No obstetric history on file.      Home Medications    Prior to Admission medications   Medication Sig Start Date End Date Taking? Authorizing Provider  Melatonin 1 MG CAPS Take 2 mg by mouth at bedtime as needed and may repeat dose one time if needed.   Yes [provider]  acetaminophen (TYLENOL) 500 MG tablet Take 500 mg by mouth every 6 (six) hours as needed.    [provider]  aspirin 81 MG tablet Take 81 mg by mouth daily.    [provider]  Calcium Carbonate-Vit D-Min (CALCIUM 1200 PO) Take by mouth.    [provider]  FLUoxetine (PROZAC) 40 MG capsule  05/04/14   [provider]  losartan-hydrochlorothiazide (HYZAAR) 100-12.5 MG per tablet Take by mouth daily. Takes 1/2 tablet daily    [provider]  meloxicam (MOBIC) 15 MG tablet  05/04/14   [provider]  NEXIUM 40 MG capsule  04/23/14   [provider]  simvastatin (ZOCOR) 40 MG tablet Take 20 mg by mouth daily. 05/04/14   [provider]  vitamin B-12 (CYANOCOBALAMIN) 1000 MCG tablet  03/11/14   [provider]  Family History Family History  Problem Relation Age of Onset  . CVA Father     Social History Social History   Tobacco Use  . Smoking status: Never Smoker  Substance Use Topics  . Alcohol use: Not on file  . Drug use: Not on file     Allergies   Patient has no known allergies.   Review of Systems Review of Systems No sore throat + cough No pleuritic pain No wheezing + nasal congestion ? post-nasal drainage No sinus pain/pressure No itchy/red eyes No earache No hemoptysis No SOB + low grade fever  No nausea No vomiting No abdominal pain No diarrhea No urinary symptoms No skin rash + fatigue ? myalgias + headache Used OTC meds without relief   Physical Exam Triage Vital Signs ED  Triage Vitals  Enc Vitals Group     BP 09/22/19 1315 127/74     Pulse Rate 09/22/19 1315 81     Resp 09/22/19 1315 16     Temp 09/22/19 1315 98.2 F (36.8 C)     Temp Source 09/22/19 1315 Oral     SpO2 09/22/19 1315 96 %     Weight 09/22/19 1323 140 lb (63.5 kg)     Height 09/22/19 1323 5\' 2"  (1.575 m)     Head Circumference --      Peak Flow --      Pain Score 09/22/19 1330 0     Pain Loc --      Pain Edu? --      Excl. in GC? --    No data found.  Updated Vital Signs BP 127/74 (BP Location: Left Arm)   Pulse 81   Temp 98.2 F (36.8 C) (Oral)   Resp 16   Ht 5\' 2"  (1.575 m)   Wt 63.5 kg   SpO2 96%   BMI 25.61 kg/m   Visual Acuity Right Eye Distance:   Left Eye Distance:   Bilateral Distance:    Right Eye Near:   Left Eye Near:    Bilateral Near:     Physical Exam Nursing notes and Vital Signs reviewed. Appearance:  Patient appears stated age, and in no acute distress.  She is alert but confused. Head:  Area of tenderness to palpation left occipital area without hematoma or evidence skull depression. Eyes:  Pupils are equal, round, and reactive to light and accomodation.  Extraocular movement is intact.  Conjunctivae are not inflamed  Ears:  Canals normal.  Tympanic membranes normal.  Nose:  Normal turbinates.  No sinus tenderness. Neck:  Supple.  No adenopathy  Lungs:  Clear to auscultation.  Breath sounds are equal.  Moving air well. Heart:  Regular rate and rhythm without murmurs, rubs, or gallops.  Abdomen:  Nontender.  Bowel sounds are present.  No CVA or flank tenderness.  Extremities:  No edema.  Skin:  No rash present.  Neurologic:  Cranial nerves 2 through 12 are normal.  Patellar, achilles, and elbow reflexes are normal.  Cerebellar function is intact (finger-to-nose)  UC Treatments / Results  Labs (all labs ordered are listed, but only abnormal results are displayed) Labs Reviewed  NOVEL CORONAVIRUS, NAA - Abnormal; Notable for the following  components:      Result Value   SARS-CoV-2, NAA Detected (*)    All other components within normal limits   Narrative:    Performed at:  [address] [place], [address]  [place] Lab Director: [place] MD,  Phone:  [phone]  POCT URINALYSIS DIP (MANUAL ENTRY) - Abnormal; Notable for the following components:   Color, UA straw (*)    Clarity, UA cloudy (*)    Bilirubin, UA small (*)    Ketones, POC UA trace (5) (*)    Blood, UA trace-intact (*)    Protein Ur, POC >=300 (*)    All other components within normal limits    EKG   Radiology CLINICAL DATA:  Cough.  EXAM: CHEST - 2 VIEW  COMPARISON:  May 06, 2008.  FINDINGS: Stable cardiomegaly. Atherosclerosis of thoracic aorta is noted. Right lung is clear. No pneumothorax is noted. Mild left basilar atelectasis or scarring is noted. Bony thorax is unremarkable.  IMPRESSION: Aortic atherosclerosis. Mild left basilar atelectasis or scarring.   Electronically Signed   By: Marijo Conception M.D.   On: 09/22/2019 15:20   CT Head WO IV Contrast1/08/2020 [facility] Result Impression  IMPRESSION: 1. Image quality and therefore interpretation significantly limited by motion artifact. 2. No definite acute intracranial abnormality 3. Chronic white matter ischemic changes are suspected  Electronically Signed by: Sharin Mons  Result Narrative  INDICATION:  Unspecified injury of head, initial encounter,  COMPARISON:  None. Prior exams cannot be obtained for comparison.  TECHNIQUE:  Multiple axial images obtained from the skull base to the vertex without IV contrast were obtained on 09/22/2019 4:22 PM  FINDINGS: The study is limited by motion artifact. Hypodensity within the bilateral cerebral white matter compatible with chronic ischemic change. No evidence of hydrocephalus. No intracranial mass, mass effect or midline shift. No acute infarction evident. No  intracranial hemorrhage. Paranasal sinuses: Clear. Mastoid air cells: Clear. Calvarium: Intact.     Procedures Procedures (including critical care time)  Medications Ordered in UC Medications - No data to display  Initial Impression / Assessment and Plan / UC Course  I have reviewed the triage vital signs and the nursing notes.  Pertinent labs & imaging results that were available during my care of the patient were reviewed by me and considered in my medical decision making (see chart for details).    Patient confused but unremarkable neurologic exam otherwise.  Suspect concussion.  CT head w/o contrast scheduled (Family requests that scan be performed at a nearby [place] facility) Note no acute changes on CT head w/o contrast. Note mild left basilar atelectasis or scarring on chest x-ray. Suspect early viral URI (possibly COVID19) COVID19 send out.  Discussed results with daughter Heron Sabins. Because of her past history of pneumonia, will begin empiric Omnicef. Recommend follow-up with her PCP in one week.   Final Clinical Impressions(s) / UC Diagnoses   Final diagnoses:  Cough  Injury of head, initial encounter  Confusion  Acute upper respiratory infection     Discharge Instructions     Isolate yourself until COVID-19 test result is available.   If your COVID19 test is positive, then you are infected with the novel coronavirus and could give the virus to others.  Please continue isolation at home for at least 10 days since the start of your symptoms. Once you complete your 10 day quarantine, you may return to normal activities as long as you've not had a fever for over 24 hours (without taking fever reducing medicine) and your symptoms are improving. Please continue good preventive care measures, including:  frequent hand-washing, avoid touching your face, cover coughs/sneezes, stay out of crowds and keep a 6 foot distance from others.  Go to the nearest hospital  emergency room if fever/cough/breathlessness are severe or illness seems like a threat to life.     ED Prescriptions    None        Lattie Haw, MD 09/24/19 1624

## 2019-09-22 NOTE — ED Notes (Signed)
1930: rx for omnicef call in [facility] per Dr.Beese.

## 2019-09-22 NOTE — Discharge Instructions (Addendum)
Isolate yourself until COVID-19 test result is available.  If your COVID19 test is positive, then you are infected with the novel coronavirus and could give the virus to others.  Please continue isolation at home for at least 10 days since the start of your symptoms.  Once you complete your 10 day quarantine, you may return to normal activities as long as you've not had a fever for over 24 hours (without taking fever reducing medicine) and your symptoms are improving. Please continue good preventive care measures, including:  frequent hand-washing, avoid touching your face, cover coughs/sneezes, stay out of crowds and keep a 6 foot distance from others.  Go to the nearest hospital emergency room if fever/cough/breathlessness are severe or illness seems like a threat to life.  

## 2019-09-22 NOTE — ED Triage Notes (Signed)
Patient here with son who is giving her history because he states she is confused and has early dementia; she fell in kitchen 2 days ago and hit back of head after feeling light headed; unsure if she passed out; has lump on back of head and bruise on right hand. She also has started coughing; low grade fever yesterday and seems to feel chilled. Concerned about more than her usual confusion.

## 2019-09-24 ENCOUNTER — Telehealth (HOSPITAL_COMMUNITY): Payer: Self-pay | Admitting: Emergency Medicine

## 2019-09-24 LAB — NOVEL CORONAVIRUS, NAA: SARS-CoV-2, NAA: DETECTED — AB

## 2019-09-24 NOTE — Telephone Encounter (Signed)
Your test for COVID-19 was positive, meaning that you were infected with the novel coronavirus and could give the germ to others.  Please continue isolation at home for at least 10 days since the start of your symptoms. If you do not have symptoms, please isolate at home for 10 days from the day you were tested. Once you complete your 10 day quarantine, you may return to normal activities as long as you've not had a fever for over 24 hours(without taking fever reducing medicine) and your symptoms are improving. Please continue good preventive care measures, including:  frequent hand-washing, avoid touching your face, cover coughs/sneezes, stay out of crowds and keep a 6 foot distance from others.  Go to the nearest hospital emergency room if fever/cough/breathlessness are severe or illness seems like a threat to life.  Contacted pt, then contacted son to answer all questions. Had extensive conversation with son regarding s/s to watch out for. He verbalized understanding, all questions answered.

## 2023-11-09 DEATH — deceased
